# Patient Record
Sex: Female | Born: 1995 | Race: White | Hispanic: No | Marital: Single | Smoking: Never smoker
Health system: Southern US, Community
[De-identification: ages and names within clinical notes are randomized; demographics above are authoritative.]

## PROBLEM LIST (undated history)

## (undated) VITALS — BP 100/58 | HR 105 | Temp 98.3°F | Resp 18 | Ht 67.5 in | Wt 171.0 lb

## (undated) DIAGNOSIS — F419 Anxiety disorder, unspecified: Secondary | ICD-10-CM

## (undated) DIAGNOSIS — F4310 Post-traumatic stress disorder, unspecified: Secondary | ICD-10-CM

## (undated) DIAGNOSIS — K589 Irritable bowel syndrome without diarrhea: Secondary | ICD-10-CM

## (undated) DIAGNOSIS — Q796 Ehlers-Danlos syndrome, unspecified: Secondary | ICD-10-CM

## (undated) DIAGNOSIS — A0472 Enterocolitis due to Clostridium difficile, not specified as recurrent: Secondary | ICD-10-CM

## (undated) DIAGNOSIS — A1801 Tuberculosis of spine: Secondary | ICD-10-CM

## (undated) DIAGNOSIS — F32A Depression, unspecified: Secondary | ICD-10-CM

## (undated) DIAGNOSIS — F329 Major depressive disorder, single episode, unspecified: Secondary | ICD-10-CM

## (undated) HISTORY — DX: Major depressive disorder, single episode, unspecified: F32.9

## (undated) HISTORY — DX: Depression, unspecified: F32.A

## (undated) HISTORY — DX: Anxiety disorder, unspecified: F41.9

## (undated) HISTORY — DX: Irritable bowel syndrome, unspecified: K58.9

## (undated) HISTORY — PX: APPENDECTOMY: SHX54

## (undated) HISTORY — PX: NO PAST SURGERIES: SHX2092

---
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
## 2016-02-25 ENCOUNTER — Telehealth (HOSPITAL_COMMUNITY): Payer: Self-pay | Admitting: *Deleted

## 2016-02-25 NOTE — Telephone Encounter (Signed)
Called for Prior authorization of ECT. Was told by Morrie SheldonAshley that prior authorization was required for ECT and not sure why this writer was told in October 2016 that is was not required by [employer]. Was told that her insurance authorization is handled through [facility] and not [employer] and that is where it may have gotten mixed up. Was told to submit the authorization from printing the form online, filling it out, and faxing to [phone]. It will be denied but then an appeal needs completed through [facility] at [phone]. Reference # [id number]. Form completed and faxed as stated. Awaiting denial to complete appeal.

## 2016-03-31 ENCOUNTER — Telehealth (HOSPITAL_COMMUNITY): Payer: Self-pay | Admitting: *Deleted

## 2016-03-31 NOTE — Telephone Encounter (Signed)
Called for appeal of ECT authorization from 2016, spoke with Elenor QuinonesVan M who states to fill out Level 1 physician appeal form from online then fax with letter of appeal after MD signs. Sent email to April Keene for clarification of who this Clinical research associatewriter can fax form to for MD signature.

## 2016-04-08 ENCOUNTER — Observation Stay (HOSPITAL_COMMUNITY)
Admission: EM | Admit: 2016-04-08 | Discharge: 2016-04-13 | Disposition: A | Discharge status: Other Acute Inpatient Hospital | Payer: BC Managed Care – PPO | Attending: Internal Medicine | Admitting: Internal Medicine

## 2016-04-08 ENCOUNTER — Encounter (HOSPITAL_COMMUNITY): Payer: Self-pay

## 2016-04-08 DIAGNOSIS — F333 Major depressive disorder, recurrent, severe with psychotic symptoms: Secondary | ICD-10-CM | POA: Diagnosis not present

## 2016-04-08 DIAGNOSIS — Z79899 Other long term (current) drug therapy: Secondary | ICD-10-CM | POA: Insufficient documentation

## 2016-04-08 DIAGNOSIS — Z793 Long term (current) use of hormonal contraceptives: Secondary | ICD-10-CM | POA: Insufficient documentation

## 2016-04-08 DIAGNOSIS — A047 Enterocolitis due to Clostridium difficile: Secondary | ICD-10-CM | POA: Insufficient documentation

## 2016-04-08 DIAGNOSIS — F32A Depression, unspecified: Secondary | ICD-10-CM | POA: Diagnosis present

## 2016-04-08 DIAGNOSIS — Z9119 Patient's noncompliance with other medical treatment and regimen: Secondary | ICD-10-CM | POA: Diagnosis not present

## 2016-04-08 DIAGNOSIS — E43 Unspecified severe protein-calorie malnutrition: Secondary | ICD-10-CM | POA: Insufficient documentation

## 2016-04-08 DIAGNOSIS — F4310 Post-traumatic stress disorder, unspecified: Secondary | ICD-10-CM | POA: Diagnosis not present

## 2016-04-08 DIAGNOSIS — R45851 Suicidal ideations: Secondary | ICD-10-CM | POA: Diagnosis not present

## 2016-04-08 DIAGNOSIS — F329 Major depressive disorder, single episode, unspecified: Secondary | ICD-10-CM | POA: Diagnosis present

## 2016-04-08 DIAGNOSIS — A0472 Enterocolitis due to Clostridium difficile, not specified as recurrent: Secondary | ICD-10-CM

## 2016-04-08 DIAGNOSIS — R440 Auditory hallucinations: Secondary | ICD-10-CM | POA: Diagnosis present

## 2016-04-08 HISTORY — DX: Post-traumatic stress disorder, unspecified: F43.10

## 2016-04-08 LAB — RAPID URINE DRUG SCREEN, HOSP PERFORMED
Amphetamines: NOT DETECTED
Barbiturates: NOT DETECTED
Benzodiazepines: NOT DETECTED
Cocaine: NOT DETECTED
OPIATES: NOT DETECTED
TETRAHYDROCANNABINOL: NOT DETECTED

## 2016-04-08 LAB — COMPREHENSIVE METABOLIC PANEL
ALBUMIN: 4.8 g/dL (ref 3.5–5.0)
ALT: 21 U/L (ref 14–54)
AST: 19 U/L (ref 15–41)
Alkaline Phosphatase: 67 U/L (ref 38–126)
Anion gap: 8 (ref 5–15)
BILIRUBIN TOTAL: 1 mg/dL (ref 0.3–1.2)
BUN: 10 mg/dL (ref 6–20)
CALCIUM: 10.1 mg/dL (ref 8.9–10.3)
CO2: 23 mmol/L (ref 22–32)
Chloride: 107 mmol/L (ref 101–111)
Creatinine, Ser: 0.83 mg/dL (ref 0.44–1.00)
GFR calc Af Amer: >60 mL/min (ref >60)
GFR calc non Af Amer: >60 mL/min (ref >60)
GLUCOSE: 93 mg/dL (ref 65–99)
Potassium: 3.7 mmol/L (ref 3.5–5.1)
SODIUM: 138 mmol/L (ref 135–145)
TOTAL PROTEIN: 8.3 g/dL — AB (ref 6.5–8.1)

## 2016-04-08 LAB — CBC
HCT: 44.6 % (ref 36.0–46.0)
Hemoglobin: 15.3 g/dL — ABNORMAL HIGH (ref 12.0–15.0)
MCH: 29.5 pg (ref 26.0–34.0)
MCHC: 34.3 g/dL (ref 30.0–36.0)
MCV: 85.9 fL (ref 78.0–100.0)
PLATELETS: 215 10*3/uL (ref 150–400)
RBC: 5.19 MIL/uL — ABNORMAL HIGH (ref 3.87–5.11)
RDW: 13 % (ref 11.5–15.5)
WBC: 6.2 10*3/uL (ref 4.0–10.5)

## 2016-04-08 LAB — ETHANOL: Alcohol, Ethyl (B): <5 mg/dL (ref <5)

## 2016-04-08 LAB — HCG, QUANTITATIVE, PREGNANCY

## 2016-04-08 MED ORDER — VANCOMYCIN 50 MG/ML ORAL SOLUTION
125.0000 mg | Freq: Four times a day (QID) | ORAL | Status: DC
  Administered 2016-04-08 – 2016-04-13 (×18): 125 mg via ORAL
  Filled 2016-04-08 (×25): qty 2.5

## 2016-04-08 MED ORDER — CLONAZEPAM 0.5 MG PO TABS: 0.5000 mg | ORAL_TABLET | Freq: Three times a day (TID) | ORAL | Status: DC | PRN

## 2016-04-08 MED ORDER — RISAQUAD PO CAPS
1.0000 | ORAL_CAPSULE | Freq: Every day | ORAL | Status: DC
  Administered 2016-04-09 – 2016-04-10 (×2): 1 via ORAL
  Filled 2016-04-08 (×2): qty 1

## 2016-04-08 MED ORDER — MELATONIN 5 MG PO CAPS: 5.0000 mg | ORAL_CAPSULE | Freq: Every day | ORAL | Status: DC
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
## 2016-04-08 NOTE — BH Assessment (Signed)
Assessment completed. Consulted Maryjean Mornharles Kober, PA-C who recommended long-term psychiatric treatment. Informed Antony MaduraKelly  Humes, PA-C that inpatient treatment was recommended.
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
## 2016-04-09 NOTE — ED Notes (Signed)
Patient's mother visiting and became very angry when she was told that she could not take outside food/[employer] into the room. Patient's mother stated, "YOu guys are a friendly bunch." explained to patient's mother the visiting rules and outside food. Patient's mother provided with a copy of the visitation rules.

## 2016-04-09 NOTE — BH Assessment (Signed)
[facility] who reported that pt is under review for inpatient treatment. [facility] requested that pt be placed under IVC due to psychosis.  Pt meets IVC criteria at this time and IVC will be initiated.

## 2016-04-09 NOTE — ED Notes (Signed)
Patient ambulated without assistance to restroom. Informed about need for stool specimen. Patient was unable to have bowel movement. Patient understands the need for specimen

## 2016-04-09 NOTE — ED Notes (Signed)
Called Brandi Stanley and was told to call back in 15 minutes because the nurse was doing med pass.

## 2016-04-09 NOTE — ED Notes (Signed)
[place] [facility].

## 2016-04-09 NOTE — ED Notes (Signed)
Per Dr. Ewing SchleinMagod pt did [facility] at [facility] for Cdiff on 03/19/16. He recommends repeat testing

## 2016-04-09 NOTE — ED Notes (Signed)
Attempted x 4 to call [facility] to give report. Writer called the main number [phone] and the [facility] [phone]. charge nurse notified. Will continue to call.

## 2016-04-09 NOTE — ED Notes (Addendum)
Charge and EDP notified of [facility] Stanley requesting a c diff test since she has been taking Vancomycin for the same. Order noted for the same.

## 2016-04-09 NOTE — ED Notes (Signed)
Pt has verbalized in tears that pt "[facility]; I've been there and I don't like it.  Please let me stay here until there's available bed across the street".  TTS was called and notified of pt's verbalizations---- TTS stated that she will pass it on to the psychiatrist in the morning.

## 2016-04-09 NOTE — BH Assessment (Signed)
Per Ann[facility]ie at [facility], pt has been accepted for admission to the services of Dr. Otho Perlaj Thotakura. [facility] and report can be called to [phone]. Antony MaduraKelly Humes, PA-C has been informed of the disposition.

## 2016-04-10 ENCOUNTER — Encounter (HOSPITAL_COMMUNITY): Payer: Self-pay | Admitting: Internal Medicine

## 2016-04-10 DIAGNOSIS — A047 Enterocolitis due to Clostridium difficile: Secondary | ICD-10-CM | POA: Diagnosis not present

## 2016-04-10 DIAGNOSIS — F32A Depression, unspecified: Secondary | ICD-10-CM | POA: Diagnosis present

## 2016-04-10 DIAGNOSIS — A0472 Enterocolitis due to Clostridium difficile, not specified as recurrent: Secondary | ICD-10-CM

## 2016-04-10 DIAGNOSIS — F329 Major depressive disorder, single episode, unspecified: Secondary | ICD-10-CM | POA: Diagnosis not present

## 2016-04-10 DIAGNOSIS — F333 Major depressive disorder, recurrent, severe with psychotic symptoms: Secondary | ICD-10-CM | POA: Diagnosis not present

## 2016-04-10 DIAGNOSIS — F4312 Post-traumatic stress disorder, chronic: Secondary | ICD-10-CM | POA: Diagnosis not present

## 2016-04-10 DIAGNOSIS — R45851 Suicidal ideations: Secondary | ICD-10-CM | POA: Diagnosis not present

## 2016-04-10 LAB — C DIFFICILE QUICK SCREEN W PCR REFLEX
C DIFFICLE (CDIFF) ANTIGEN: NEGATIVE
C Diff interpretation: NOT DETECTED
C Diff toxin: NEGATIVE

## 2016-04-10 MED ORDER — ENOXAPARIN SODIUM 40 MG/0.4ML ~~LOC~~ SOLN
40.0000 mg | SUBCUTANEOUS | Status: DC
  Administered 2016-04-10 – 2016-04-12 (×3): 40 mg via SUBCUTANEOUS
  Filled 2016-04-10 (×3): qty 0.4

## 2016-04-10 MED ORDER — NORETHIN ACE-ETH ESTRAD-FE 1.5-30 MG-MCG PO TABS
1.0000 | ORAL_TABLET | Freq: Every day | ORAL | Status: DC
  Administered 2016-04-12 – 2016-04-13 (×2): 1 via ORAL

## 2016-04-10 MED ORDER — ONDANSETRON HCL 4 MG/2ML IJ SOLN: 4.0000 mg | Freq: Four times a day (QID) | INTRAMUSCULAR | Status: DC | PRN

## 2016-04-10 MED ORDER — OXCARBAZEPINE 300 MG PO TABS
600.0000 mg | ORAL_TABLET | Freq: Every day | ORAL | Status: DC
  Administered 2016-04-10 – 2016-04-13 (×4): 600 mg via ORAL
  Filled 2016-04-10 (×4): qty 2

## 2016-04-10 MED ORDER — RISPERIDONE 1 MG PO TABS
1.0000 mg | ORAL_TABLET | Freq: Every day | ORAL | Status: DC
  Administered 2016-04-10 – 2016-04-13 (×4): 1 mg via ORAL
  Filled 2016-04-10 (×4): qty 1

## 2016-04-10 MED ORDER — PRAZOSIN HCL 2 MG PO CAPS
2.0000 mg | ORAL_CAPSULE | Freq: Every day | ORAL | Status: DC
  Administered 2016-04-10 – 2016-04-12 (×3): 2 mg via ORAL
  Filled 2016-04-10 (×3): qty 1

## 2016-04-10 MED ORDER — ENSURE ENLIVE PO LIQD
237.0000 mL | Freq: Two times a day (BID) | ORAL | Status: DC
  Administered 2016-04-10 – 2016-04-12 (×3): 237 mL via ORAL

## 2016-04-10 MED ORDER — PAROXETINE HCL ER 12.5 MG PO TB24
12.5000 mg | ORAL_TABLET | Freq: Every day | ORAL | Status: DC
  Administered 2016-04-10 – 2016-04-13 (×4): 12.5 mg via ORAL
  Filled 2016-04-10 (×5): qty 1

## 2016-04-10 MED ORDER — MIRTAZAPINE 15 MG PO TABS
15.0000 mg | ORAL_TABLET | Freq: Every day | ORAL | Status: DC
  Administered 2016-04-10 – 2016-04-12 (×3): 15 mg via ORAL
  Filled 2016-04-10 (×3): qty 1

## 2016-04-10 MED ORDER — TRAZODONE HCL 100 MG PO TABS
100.0000 mg | ORAL_TABLET | Freq: Every day | ORAL | Status: DC
  Administered 2016-04-10 – 2016-04-12 (×3): 100 mg via ORAL
  Filled 2016-04-10 (×3): qty 1

## 2016-04-10 MED ORDER — VENLAFAXINE HCL ER 75 MG PO CP24
225.0000 mg | ORAL_CAPSULE | Freq: Every morning | ORAL | Status: DC
  Administered 2016-04-10 – 2016-04-13 (×4): 225 mg via ORAL
  Filled 2016-04-10 (×4): qty 1

## 2016-04-10 MED ORDER — PRAZOSIN HCL 2 MG PO CAPS: 2.0000 mg | ORAL_CAPSULE | Freq: Every day | ORAL | Status: DC

## 2016-04-10 MED ORDER — ONDANSETRON HCL 4 MG PO TABS: 4.0000 mg | ORAL_TABLET | Freq: Four times a day (QID) | ORAL | Status: DC | PRN

## 2016-04-10 MED ORDER — ARIPIPRAZOLE 5 MG PO TABS
5.0000 mg | ORAL_TABLET | Freq: Two times a day (BID) | ORAL | Status: DC
  Administered 2016-04-10 – 2016-04-13 (×7): 5 mg via ORAL
  Filled 2016-04-10 (×7): qty 1
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
## 2016-04-10 NOTE — ED Notes (Signed)
[facility] for [facility].

## 2016-04-10 NOTE — ED Notes (Signed)
Spoke with OV intake and OV nurse. [facility]. Intake and nurse aware that patient does not have loose stools and has had only solid stools since being in ED and can therefore not have c-diff test.

## 2016-04-10 NOTE — ED Provider Notes (Addendum)
Pt has not had any diarrhea for the last 24 hours however she is getting treatment for C diff.  Pt is not able to go to a psychiatric hosptial with that diagnosis.  Discussed with Dr Shela CommonsJ.   [facility].  I will consult with the hospitalist.   Linwood DibblesJon Jodee Wagenaar, MD 04/10/16 1149  Discussed with Dr Radonna RickerFeliz.  Requests ID consult.  Clinical Course  Comment By Time    Pt states she did have 3 loose stools yesterday.  1 loose stool 15 minutes ago.  Discussed with Dr Ilsa IhaSnyder.  Continue oral vanc 125 mg 4 times daily for 9 more days Linwood DibblesJon Derrell Milanes, MD 08/20 1220   Pt is not medically clear for psych disposition.   Linwood DibblesJon Naveh Rickles, MD 04/10/16 1224

## 2016-04-10 NOTE — ED Notes (Addendum)
Documented patients I&O based on off going techs report. Patient has had 2 bowl moments and neither were loose or watery. Both movements were formed stool.

## 2016-04-11 DIAGNOSIS — F4312 Post-traumatic stress disorder, chronic: Secondary | ICD-10-CM | POA: Diagnosis not present

## 2016-04-11 DIAGNOSIS — A047 Enterocolitis due to Clostridium difficile: Secondary | ICD-10-CM | POA: Diagnosis not present

## 2016-04-11 DIAGNOSIS — R45851 Suicidal ideations: Secondary | ICD-10-CM | POA: Diagnosis not present

## 2016-04-11 DIAGNOSIS — F329 Major depressive disorder, single episode, unspecified: Secondary | ICD-10-CM | POA: Diagnosis not present

## 2016-04-11 DIAGNOSIS — F333 Major depressive disorder, recurrent, severe with psychotic symptoms: Secondary | ICD-10-CM | POA: Diagnosis not present

## 2016-04-11 MED ORDER — PAROXETINE HCL ER 12.5 MG PO TB24: 12.5000 mg | ORAL_TABLET | Freq: Every day | ORAL | Status: DC
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
## 2016-04-11 NOTE — Progress Notes (Addendum)
LCSWA followind patient for disposition to inpatient psychiatric hospital: Faxed clinical to: [id number]:Pending [facility]:Denied due to inability to isolate patient due to C. difficile [facility]:Pending  [facility]: Pending  [facility]:No Beds [facility]  [facility]  [facility]:Pending   Will inform once bed is available.

## 2016-04-11 NOTE — Progress Notes (Deleted)
Date: April 11, 2016 Discharge orders checked for needs. No needs present at time of discharge. Auguste Tebbetts, RN, BSN, CCM   [phone] 

## 2016-04-11 NOTE — Discharge Summary (Signed)
Physician Discharge Summary  Baker PieriniBrenna J Polendo OZH:[id number]:[id number] DOB: 13-Jan-1996 DOA: 04/08/2016  PCP: Dahlia ByesUCKER, ELIZABETH, MD  Admit date: 04/08/2016 Discharge date: 04/11/2016  Admitted From: home [facility]  Recommendations for Outpatient Follow-up:  1. Follow up with PCP in 1-2 weeks 2.   Home Health:no Equipment/Devices:none  Discharge Condition:stable  CODE STATUS:Full Diet recommendation: Regular   Brief/Interim Summary: 20 year old with past medical history of anxiety and depression, and C. difficile who presented to the emergency room with a [facility] was consulted who recommended inpatient rehabilitation, due to the patient having ongoing diarrhea 2 to proceed if it was recommended that she admitted to the hospital until her C. difficile was better control.  Discharge Diagnoses:  Active Problems:   Depression   C. difficile colitis  She has been excepted to behavioral health, she was started on oral vancomycin, she did not have a single bowel movement while in the hospital. We'll continue vancomycin for total 14 days 4 times a day. She stable to be transferred to behavioral health.  Discharge Instructions  Discharge Instructions    Diet - low sodium heart healthy    Complete by:  As directed   Increase activity slowly    Complete by:  As directed       Medication List    TAKE these medications   acidophilus Caps capsule Take 1 capsule by mouth daily.   asenapine 5 MG Subl 24 hr tablet Commonly known as:  SAPHRIS Place 2 tablets (10 mg total) under the tongue at bedtime. For mood control   clonazePAM 0.5 MG tablet Commonly known as:  KLONOPIN Take 1 tablet (0.5 mg) three times daily: For severe anxiety   EPIPEN IJ Inject 1 Dose as directed once as needed (allergic reaction).   Lurasidone HCl 120 MG Tabs Take 1 tablet (120 mg total) by mouth at bedtime. For mood control   Melatonin 5 MG Caps Take 5 mg by mouth at bedtime.    MICROGESTIN FE 1.5/30 1.5-30 MG-MCG tablet Generic drug:  norethindrone-ethinyl estradiol-iron Take 1 tablet by mouth daily. For Hormonal replacement   mirtazapine 15 MG tablet Commonly known as:  REMERON Take 1 tablet (15 mg total) by mouth at bedtime. For depression/insomnia   naltrexone 50 MG tablet Commonly known as:  DEPADE Take 100 mg by mouth daily.   neomycin-bacitracin-polymyxin Oint Commonly known as:  NEOSPORIN Apply 1 application topically as needed for wound care.   oxcarbazepine 600 MG tablet Commonly known as:  TRILEPTAL Take 600 mg by mouth daily.   PARoxetine 12.5 MG 24 hr tablet Commonly known as:  PAXIL-CR Take 1 tablet (12.5 mg total) by mouth daily.   prazosin 2 MG capsule Commonly known as:  MINIPRESS Take 1 capsule (2 mg total) by mouth at bedtime. For nightmares   risperiDONE 1 MG tablet Commonly known as:  RISPERDAL Take 1 mg by mouth daily.   traZODone 100 MG tablet Commonly known as:  DESYREL Take 100 mg by mouth at bedtime.   VANCOCIN HCL 125 MG capsule Generic drug:  vancomycin Take 125 mg by mouth every 6 (six) hours.   venlafaxine XR 75 MG 24 hr capsule Commonly known as:  EFFEXOR-XR Take 3 capsules (225 mg total) by mouth every morning. For depression   VITAMIN B-12 PO Take 1 tablet by mouth daily.      Follow-up Information    Dahlia ByesUCKER, ELIZABETH, MD.   Specialty:  Pediatrics Why:  As needed Contact information: [address] [place] [id number] [phone]        [facility]-EMERGENCY DEPT.   Specialty:  Emergency Medicine Why:  If symptoms worsen Contact information: [address] [id number] [place] [id number] [phone]         Allergies  Allergen Reactions  . Bee Venom Anaphylaxis  . Percocet [Oxycodone-Acetaminophen] Shortness Of Breath and Itching  . Penicillins Hives and Other (See Comments)    Has patient had a PCN reaction causing immediate  rash, facial/tongue/throat swelling, SOB or lightheadedness with hypotension: No Has patient had a PCN reaction causing severe rash involving mucus membranes or skin necrosis: No Has patient had a PCN reaction that required hospitalization No Has patient had a PCN reaction occurring within the last 10 years: No If all of the above answers are "NO", then may proceed with Cephalosporin use.    Consultations: none  Procedures/Studies:  No results found.    Subjective:   Discharge Exam: Vitals:   04/10/16 2115 04/11/16 0525  BP: 130/82 134/74  Pulse: 77 (!) 119  Resp: 18 16  Temp: 97.8 F (36.6 C) 97.5 F (36.4 C)   Vitals:   04/10/16 1332 04/10/16 1422 04/10/16 2115 04/11/16 0525  BP:  121/67 130/82 134/74  Pulse:  77 77 (!) 119  Resp:  16 18 16   Temp:  97.7 F (36.5 C) 97.8 F (36.6 C) 97.5 F (36.4 C)  TempSrc:  Oral Oral Oral  SpO2:  98% 99% 98%  Weight:      Height: 5\' 8"  (1.727 m)       General: Pt is alert, awake, not in acute distress Cardiovascular: RRR, S1/S2 +, no rubs, no gallops Respiratory: CTA bilaterally, no wheezing, no rhonchi Abdominal: Soft, NT, ND, bowel sounds + Extremities: no edema, no cyanosis    The results of significant diagnostics from this hospitalization (including imaging, microbiology, ancillary and laboratory) are listed below for reference.     Microbiology: Recent Results (from the past 240 hour(s))  C difficile quick scan w PCR reflex     Status: None   Collection Time: 04/10/16 11:38 AM  Result Value Ref Range Status   C Diff antigen NEGATIVE NEGATIVE Final   C Diff toxin NEGATIVE NEGATIVE Final   C Diff interpretation No C. difficile detected.  Final     Labs: BNP (last 3 results) No results for input(s): BNP in the last 8760 hours. Basic Metabolic Panel:  Recent Labs Lab 04/08/16 1902  NA 138  K 3.7  CL 107  CO2 23  GLUCOSE 93  BUN 10  CREATININE 0.83  CALCIUM 10.1   Liver Function Tests:  Recent  Labs Lab 04/08/16 1902  AST 19  ALT 21  ALKPHOS 67  BILITOT 1.0  PROT 8.3*  ALBUMIN 4.8   No results for input(s): LIPASE, AMYLASE in the last 168 hours. No results for input(s): AMMONIA in the last 168 hours. CBC:  Recent Labs Lab 04/08/16 1902  WBC 6.2  HGB 15.3*  HCT 44.6  MCV 85.9  PLT 215   Cardiac Enzymes: No results for input(s): CKTOTAL, CKMB, CKMBINDEX, TROPONINI in the last 168 hours. BNP: Invalid input(s): POCBNP CBG: No results for input(s): GLUCAP in the last 168 hours. D-Dimer No results for input(s): DDIMER in the last 72 hours. Hgb A1c No results for input(s): HGBA1C in the last 72 hours. Lipid Profile No results for input(s): CHOL, HDL, LDLCALC, TRIG, CHOLHDL, LDLDIRECT in the last 72 hours. Thyroid function  studies No results for input(s): TSH, T4TOTAL, T3FREE, THYROIDAB in the last 72 hours.  Invalid input(s): FREET3 Anemia work up No results for input(s): VITAMINB12, FOLATE, FERRITIN, TIBC, IRON, RETICCTPCT in the last 72 hours. Urinalysis    Component Value Date/Time   COLORURINE YELLOW (A) 05/15/2015 1745   APPEARANCEUR TURBID (A) 05/15/2015 1745   LABSPEC 1.026 05/15/2015 1745   PHURINE 5.0 05/15/2015 1745   GLUCOSEU NEGATIVE 05/15/2015 1745   HGBUR NEGATIVE 05/15/2015 1745   BILIRUBINUR NEGATIVE 05/15/2015 1745   KETONESUR NEGATIVE 05/15/2015 1745   PROTEINUR NEGATIVE 05/15/2015 1745   NITRITE NEGATIVE 05/15/2015 1745   LEUKOCYTESUR 2+ (A) 05/15/2015 1745   Sepsis Labs Invalid input(s): PROCALCITONIN,  WBC,  LACTICIDVEN Microbiology Recent Results (from the past 240 hour(s))  C difficile quick scan w PCR reflex     Status: None   Collection Time: 04/10/16 11:38 AM  Result Value Ref Range Status   C Diff antigen NEGATIVE NEGATIVE Final   C Diff toxin NEGATIVE NEGATIVE Final   C Diff interpretation No C. difficile detected.  Final     Time coordinating discharge: Over 30 minutes  SIGNED:   Marinda Elk, MD  [facility] 04/11/2016, 8:01 AM Pager   If 7PM-7AM, please contact night-coverage [email or web address] Password [email or web address]

## 2016-04-12 DIAGNOSIS — F329 Major depressive disorder, single episode, unspecified: Secondary | ICD-10-CM | POA: Diagnosis not present

## 2016-04-12 DIAGNOSIS — A047 Enterocolitis due to Clostridium difficile: Secondary | ICD-10-CM | POA: Diagnosis not present
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
## 2016-04-13 DIAGNOSIS — F4312 Post-traumatic stress disorder, chronic: Secondary | ICD-10-CM | POA: Diagnosis not present

## 2016-04-13 DIAGNOSIS — F329 Major depressive disorder, single episode, unspecified: Secondary | ICD-10-CM | POA: Diagnosis not present

## 2016-04-13 DIAGNOSIS — F333 Major depressive disorder, recurrent, severe with psychotic symptoms: Secondary | ICD-10-CM | POA: Diagnosis not present

## 2016-04-13 DIAGNOSIS — E43 Unspecified severe protein-calorie malnutrition: Secondary | ICD-10-CM | POA: Insufficient documentation

## 2016-04-13 DIAGNOSIS — A047 Enterocolitis due to Clostridium difficile: Secondary | ICD-10-CM | POA: Diagnosis not present

## 2016-04-13 DIAGNOSIS — R45851 Suicidal ideations: Secondary | ICD-10-CM | POA: Diagnosis not present
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
## 2016-04-13 NOTE — Progress Notes (Signed)
[facility] [place] [facility]
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
## 2016-11-21 ENCOUNTER — Other Ambulatory Visit: Payer: Self-pay | Admitting: Physician Assistant

## 2016-11-21 DIAGNOSIS — R197 Diarrhea, unspecified: Secondary | ICD-10-CM

## 2016-11-21 DIAGNOSIS — K589 Irritable bowel syndrome without diarrhea: Secondary | ICD-10-CM

## 2016-11-21 DIAGNOSIS — R1030 Lower abdominal pain, unspecified: Secondary | ICD-10-CM

## 2016-11-21 DIAGNOSIS — R1010 Upper abdominal pain, unspecified: Secondary | ICD-10-CM

## 2016-11-22 ENCOUNTER — Ambulatory Visit (INDEPENDENT_AMBULATORY_CARE_PROVIDER_SITE_OTHER): Payer: BC Managed Care – PPO | Admitting: Neurology

## 2016-11-22 ENCOUNTER — Encounter (INDEPENDENT_AMBULATORY_CARE_PROVIDER_SITE_OTHER): Payer: Self-pay

## 2016-11-22 ENCOUNTER — Encounter: Payer: Self-pay | Admitting: Neurology

## 2016-11-22 VITALS — BP 124/78 | HR 72 | Ht 69.0 in | Wt 158.5 lb

## 2016-11-22 DIAGNOSIS — F333 Major depressive disorder, recurrent, severe with psychotic symptoms: Secondary | ICD-10-CM

## 2016-11-22 DIAGNOSIS — F4310 Post-traumatic stress disorder, unspecified: Secondary | ICD-10-CM
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
## 2016-11-22 NOTE — Patient Instructions (Signed)
We will get neuropsychological testing done. 

## 2016-11-24 ENCOUNTER — Ambulatory Visit
Admission: RE | Admit: 2016-11-24 | Discharge: 2016-11-24 | Disposition: A | Discharge status: Home / Self Care | Payer: BC Managed Care – PPO | Source: Ambulatory Visit | Attending: Physician Assistant | Admitting: Physician Assistant

## 2016-11-24 DIAGNOSIS — K589 Irritable bowel syndrome without diarrhea: Secondary | ICD-10-CM

## 2016-11-24 DIAGNOSIS — R197 Diarrhea, unspecified: Secondary | ICD-10-CM

## 2016-11-24 DIAGNOSIS — R1010 Upper abdominal pain, unspecified: Secondary | ICD-10-CM

## 2016-11-24 DIAGNOSIS — R1030 Lower abdominal pain, unspecified: Secondary | ICD-10-CM

## 2016-11-24 MED ORDER — IOPAMIDOL (ISOVUE-300) INJECTION 61%
100.0000 mL | Freq: Once | INTRAVENOUS | Status: AC | PRN
  Administered 2016-11-24: 100 mL via INTRAVENOUS

## 2017-03-20 ENCOUNTER — Other Ambulatory Visit (HOSPITAL_COMMUNITY): Payer: Self-pay | Admitting: Physician Assistant

## 2017-03-20 ENCOUNTER — Other Ambulatory Visit: Payer: Self-pay | Admitting: Physician Assistant

## 2017-03-20 DIAGNOSIS — R110 Nausea: Secondary | ICD-10-CM

## 2017-03-24 ENCOUNTER — Ambulatory Visit
Admission: RE | Admit: 2017-03-24 | Discharge: 2017-03-24 | Disposition: A | Discharge status: Home / Self Care | Payer: BC Managed Care – PPO | Source: Ambulatory Visit | Attending: Physician Assistant | Admitting: Physician Assistant

## 2017-03-24 DIAGNOSIS — R110 Nausea: Secondary | ICD-10-CM

## 2017-03-27 ENCOUNTER — Ambulatory Visit (HOSPITAL_COMMUNITY)
Admission: RE | Admit: 2017-03-27 | Discharge: 2017-03-27 | Disposition: A | Discharge status: Home / Self Care | Payer: BC Managed Care – PPO | Source: Ambulatory Visit | Attending: Physician Assistant | Admitting: Physician Assistant

## 2017-03-27 ENCOUNTER — Encounter (HOSPITAL_COMMUNITY): Payer: Self-pay | Admitting: Radiology

## 2017-03-27 DIAGNOSIS — R110 Nausea: Secondary | ICD-10-CM | POA: Insufficient documentation

## 2017-03-27 MED ORDER — TECHNETIUM TC 99M MEBROFENIN IV KIT
5.0000 | PACK | Freq: Once | INTRAVENOUS | Status: AC | PRN
  Administered 2017-03-27: 5 via INTRAVENOUS

## 2017-04-07 ENCOUNTER — Other Ambulatory Visit: Payer: Self-pay | Admitting: Gastroenterology

## 2017-04-07 DIAGNOSIS — R197 Diarrhea, unspecified: Secondary | ICD-10-CM

## 2017-04-14 ENCOUNTER — Ambulatory Visit
Admission: RE | Admit: 2017-04-14 | Discharge: 2017-04-14 | Disposition: A | Discharge status: Home / Self Care | Payer: BC Managed Care – PPO | Source: Ambulatory Visit | Attending: Gastroenterology | Admitting: Gastroenterology

## 2017-04-14 DIAGNOSIS — R197 Diarrhea, unspecified: Secondary | ICD-10-CM

## 2017-06-30 DIAGNOSIS — Z23 Encounter for immunization: Secondary | ICD-10-CM | POA: Diagnosis not present

## 2017-08-24 IMAGING — CR DG CHEST 2V
1 series · 2 of 2 positions shown · non-contrast
Comparison: None.

CLINICAL DATA: Anxiety and depression.

EXAM:
CHEST  2 VIEW

[Series 1: dg chest 2 view · 0.14mm/px · 2 of 2 slices shown]
[im 1/2]
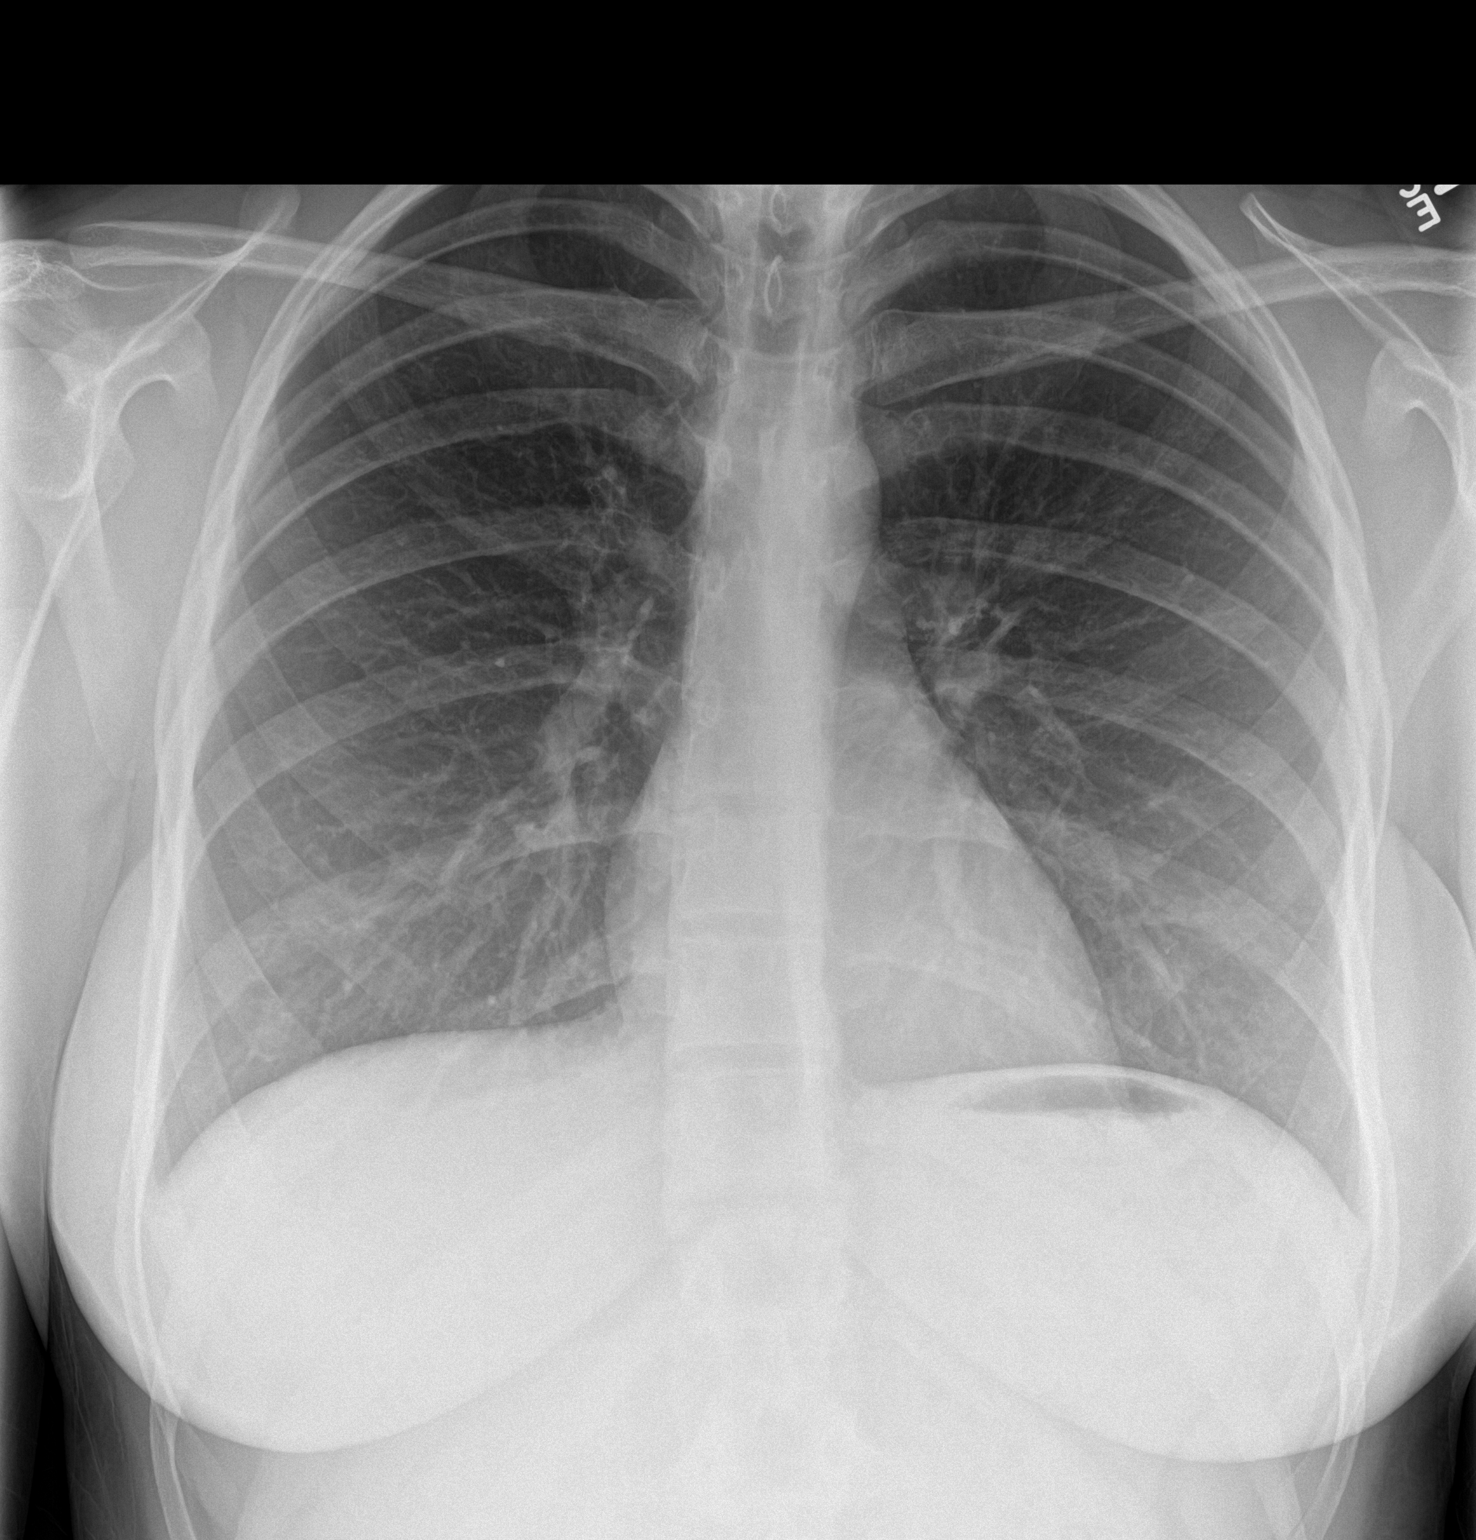
[im 2/2]
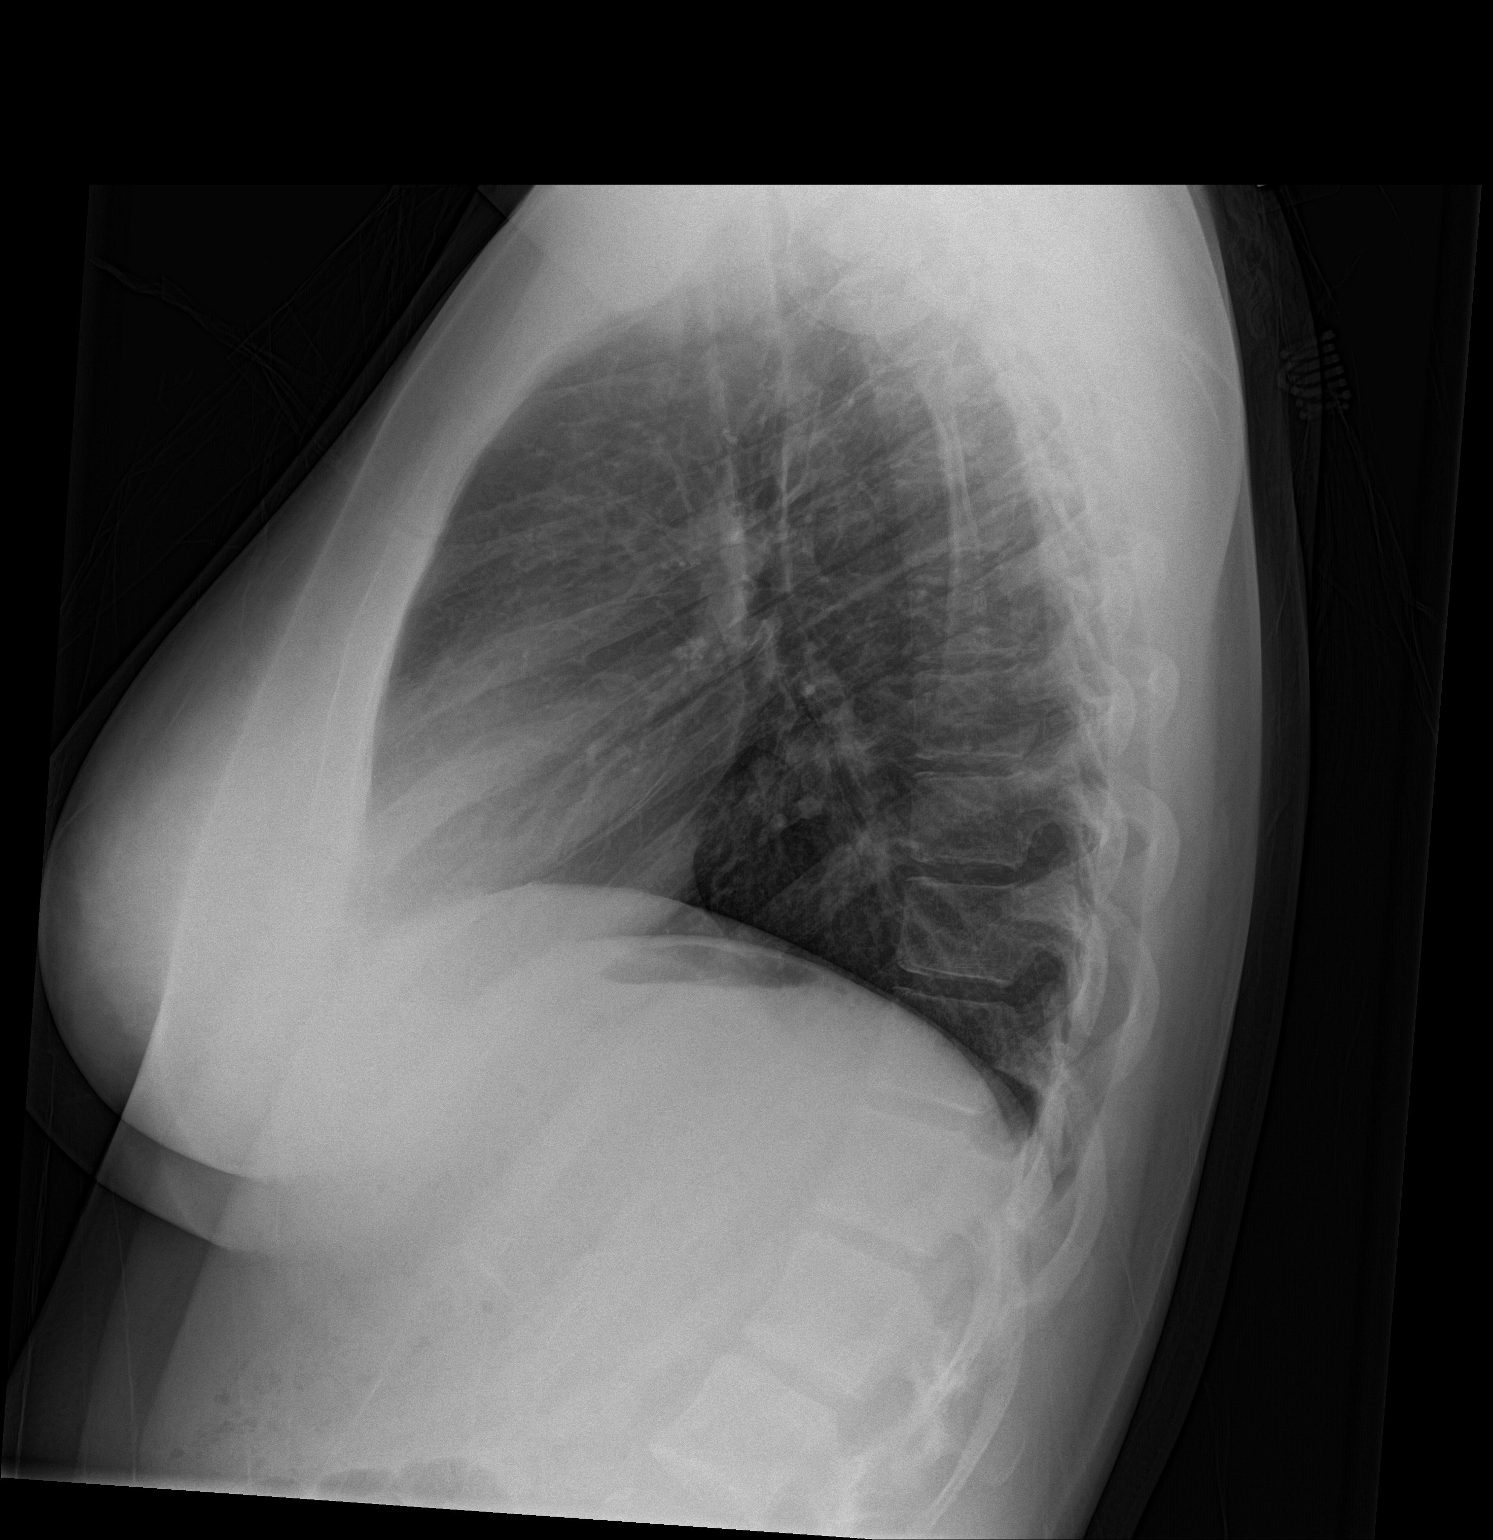

[2 of 2 positions shown; findings below may reference images not displayed]

FINDINGS: The heart size and mediastinal contours are within normal limits.
Both lungs are clear. The visualized skeletal structures are
unremarkable.
IMPRESSION: No active cardiopulmonary disease.

## 2017-10-25 DIAGNOSIS — F4310 Post-traumatic stress disorder, unspecified: Secondary | ICD-10-CM | POA: Diagnosis not present

## 2017-10-25 DIAGNOSIS — K580 Irritable bowel syndrome with diarrhea: Secondary | ICD-10-CM | POA: Diagnosis not present

## 2017-11-13 DIAGNOSIS — Z79899 Other long term (current) drug therapy: Secondary | ICD-10-CM | POA: Diagnosis not present

## 2017-11-13 DIAGNOSIS — F340 Cyclothymic disorder: Secondary | ICD-10-CM | POA: Diagnosis not present

## 2017-11-13 DIAGNOSIS — E559 Vitamin D deficiency, unspecified: Secondary | ICD-10-CM | POA: Diagnosis not present

## 2017-11-17 DIAGNOSIS — R509 Fever, unspecified: Secondary | ICD-10-CM | POA: Diagnosis not present

## 2017-11-17 DIAGNOSIS — M542 Cervicalgia: Secondary | ICD-10-CM | POA: Diagnosis not present

## 2017-11-17 DIAGNOSIS — B349 Viral infection, unspecified: Secondary | ICD-10-CM | POA: Diagnosis not present

## 2017-11-17 DIAGNOSIS — J029 Acute pharyngitis, unspecified: Secondary | ICD-10-CM | POA: Diagnosis not present

## 2017-11-17 DIAGNOSIS — R110 Nausea: Secondary | ICD-10-CM | POA: Diagnosis not present

## 2017-11-17 DIAGNOSIS — R51 Headache: Secondary | ICD-10-CM | POA: Diagnosis not present

## 2017-11-17 DIAGNOSIS — H5713 Ocular pain, bilateral: Secondary | ICD-10-CM | POA: Diagnosis not present

## 2017-12-20 ENCOUNTER — Encounter (HOSPITAL_BASED_OUTPATIENT_CLINIC_OR_DEPARTMENT_OTHER): Payer: Self-pay

## 2017-12-20 ENCOUNTER — Other Ambulatory Visit: Payer: Self-pay

## 2017-12-20 ENCOUNTER — Emergency Department (HOSPITAL_BASED_OUTPATIENT_CLINIC_OR_DEPARTMENT_OTHER)
Admission: EM | Admit: 2017-12-20 | Discharge: 2017-12-20 | Disposition: A | Discharge status: Home / Self Care | Payer: BC Managed Care – PPO | Attending: Emergency Medicine | Admitting: Emergency Medicine

## 2017-12-20 ENCOUNTER — Emergency Department (HOSPITAL_BASED_OUTPATIENT_CLINIC_OR_DEPARTMENT_OTHER): Payer: BC Managed Care – PPO

## 2017-12-20 DIAGNOSIS — Z79899 Other long term (current) drug therapy: Secondary | ICD-10-CM | POA: Insufficient documentation

## 2017-12-20 DIAGNOSIS — M79641 Pain in right hand: Secondary | ICD-10-CM | POA: Insufficient documentation

## 2017-12-20 MED ORDER — MELOXICAM 15 MG PO TABS: 15.0000 mg | ORAL_TABLET | Freq: Every day | ORAL | 0 refills | Status: DC

## 2017-12-20 MED FILL — MELOXICAM 15 MG TABLET: 15 | 30 days supply | Qty: 30 | Fill #0

## 2017-12-20 NOTE — ED Notes (Signed)
NAD at this time. Pt is stable and going home.  

## 2017-12-20 NOTE — ED Triage Notes (Signed)
C/o pain to right hand at 5th metacarpal x 2 weeks-denies injury-no bruising/swelling/break in skin noted-NAD-steady gait

## 2017-12-20 NOTE — Discharge Instructions (Signed)
Please read and follow all provided instructions.  You have been seen today for right hand pain  Tests performed today include: An x-ray of the affected area - does NOT show any broken bones or dislocations.  Vital signs. See below for your results today.   Home care instructions: -- *PRICE  Protect (with brace, splint, sling), if given by your provider (where the one that your state you already have at home) Rest Ice- Do not apply ice pack directly to your skin, place towel or similar between your skin and ice/ice pack. Apply ice for 20 min, then remove for 40 min while awake Compression- Wear brace, elastic bandage, splint as directed by your provider Elevate affected extremity above the level of your heart when not walking around for the first 24-48 hours   Use Mobic with food once daily as prescribed.   HOW TO MAKE AN ICE PACK  To make an ice pack, do one of the following:  Place crushed ice or a bag of frozen vegetables in a sealable plastic bag. Squeeze out the excess air. Place this bag inside another plastic bag. Slide the bag into a pillowcase or place a damp towel between your skin and the bag.  Mix 3 parts water with 1 part rubbing alcohol. Freeze the mixture in a sealable plastic bag. When you remove the mixture from the freezer, it will be slushy. Squeeze out the excess air. Place this bag inside another plastic bag. Slide the bag into a pillowcase or place a damp towel between your skin and the ice pack.   Follow-up instructions: Please follow-up with your primary care provider if you continue to have significant pain in 1 week. In this case you may have a more severe injury that requires further care.   Return instructions:  Please return if your toes or feet are numb or tingling, appear gray or blue, or you have severe pain (also elevate the leg and loosen splint or wrap if you were given one) If you have any fever, joint swelling, inability to move the wrist/fingers.    Please return to the Emergency Department if you experience worsening symptoms.  Please return if you have any other emergent concerns. Additional Information:  Your vital signs today were: BP 133/80 (BP Location: Left Arm)    Pulse 88    Temp 98.2 F (36.8 C) (Oral)    Resp 18    Ht  (1.753 m)    Wt 79.4 kg (175 lb 0.7 oz)    SpO2 99%    BMI 25.85 kg/m  If your blood pressure (BP) was elevated above 135/85 this visit, please have this repeated by your doctor within one month. ---------------
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
## 2018-04-20 ENCOUNTER — Emergency Department (HOSPITAL_BASED_OUTPATIENT_CLINIC_OR_DEPARTMENT_OTHER)
Admission: EM | Admit: 2018-04-20 | Discharge: 2018-04-20 | Disposition: A | Discharge status: Home / Self Care | Payer: BC Managed Care – PPO | Attending: Emergency Medicine | Admitting: Emergency Medicine

## 2018-04-20 ENCOUNTER — Encounter (HOSPITAL_BASED_OUTPATIENT_CLINIC_OR_DEPARTMENT_OTHER): Payer: Self-pay

## 2018-04-20 ENCOUNTER — Other Ambulatory Visit: Payer: Self-pay

## 2018-04-20 DIAGNOSIS — R42 Dizziness and giddiness: Secondary | ICD-10-CM | POA: Insufficient documentation

## 2018-04-20 DIAGNOSIS — R1084 Generalized abdominal pain: Secondary | ICD-10-CM

## 2018-04-20 DIAGNOSIS — R110 Nausea: Secondary | ICD-10-CM | POA: Diagnosis not present

## 2018-04-20 DIAGNOSIS — Z79899 Other long term (current) drug therapy: Secondary | ICD-10-CM | POA: Diagnosis not present

## 2018-04-20 DIAGNOSIS — R197 Diarrhea, unspecified: Secondary | ICD-10-CM

## 2018-04-20 HISTORY — DX: Enterocolitis due to Clostridium difficile, not specified as recurrent: A04.72

## 2018-04-20 LAB — CBC WITH DIFFERENTIAL/PLATELET
Basophils Absolute: 0 10*3/uL (ref 0.0–0.1)
Basophils Relative: 1 %
EOS ABS: 0 10*3/uL (ref 0.0–0.7)
Eosinophils Relative: 1 %
HEMATOCRIT: 39.5 % (ref 36.0–46.0)
HEMOGLOBIN: 13.6 g/dL (ref 12.0–15.0)
LYMPHS ABS: 1.3 10*3/uL (ref 0.7–4.0)
Lymphocytes Relative: 24 %
MCH: 30 pg (ref 26.0–34.0)
MCHC: 34.4 g/dL (ref 30.0–36.0)
MCV: 87 fL (ref 78.0–100.0)
MONO ABS: 0.6 10*3/uL (ref 0.1–1.0)
MONOS PCT: 11 %
NEUTROS ABS: 3.5 10*3/uL (ref 1.7–7.7)
Neutrophils Relative %: 65 %
Platelets: 165 10*3/uL (ref 150–400)
RBC: 4.54 MIL/uL (ref 3.87–5.11)
RDW: 13 % (ref 11.5–15.5)
WBC: 5.4 10*3/uL (ref 4.0–10.5)

## 2018-04-20 LAB — URINALYSIS, ROUTINE W REFLEX MICROSCOPIC
BILIRUBIN URINE: NEGATIVE
Glucose, UA: NEGATIVE mg/dL
HGB URINE DIPSTICK: NEGATIVE
KETONES UR: NEGATIVE mg/dL
Leukocytes, UA: NEGATIVE
Nitrite: NEGATIVE
PH: 5.5 (ref 5.0–8.0)
Protein, ur: NEGATIVE mg/dL

## 2018-04-20 LAB — BASIC METABOLIC PANEL
ANION GAP: 10 (ref 5–15)
BUN: 14 mg/dL (ref 6–20)
CALCIUM: 9.2 mg/dL (ref 8.9–10.3)
CO2: 22 mmol/L (ref 22–32)
CREATININE: 0.76 mg/dL (ref 0.44–1.00)
Chloride: 105 mmol/L (ref 98–111)
GFR calc Af Amer: >60 mL/min (ref >60)
GFR calc non Af Amer: >60 mL/min (ref >60)
GLUCOSE: 88 mg/dL (ref 70–99)
Potassium: 3.6 mmol/L (ref 3.5–5.1)
Sodium: 137 mmol/L (ref 135–145)

## 2018-04-20 LAB — PREGNANCY, URINE: Preg Test, Ur: NEGATIVE

## 2018-04-20 MED ORDER — ONDANSETRON HCL 4 MG/2ML IJ SOLN
4.0000 mg | Freq: Once | INTRAMUSCULAR | Status: AC
  Administered 2018-04-20: 4 mg via INTRAVENOUS
  Filled 2018-04-20: qty 2

## 2018-04-20 MED ORDER — ONDANSETRON HCL 4 MG PO TABS: 4.0000 mg | ORAL_TABLET | Freq: Four times a day (QID) | ORAL | 0 refills | Status: DC

## 2018-04-20 MED ORDER — SODIUM CHLORIDE 0.9 % IV BOLUS
1000.0000 mL | Freq: Once | INTRAVENOUS | Status: AC
  Administered 2018-04-20: 1000 mL via INTRAVENOUS

## 2018-04-20 NOTE — ED Notes (Signed)
Attempted to give stool sample- unsuccessful at this time.

## 2018-04-20 NOTE — ED Notes (Signed)
Pt given instructions of how to collect stool sample for C-diff and GI panel. Pt to bring back when collected.

## 2018-04-20 NOTE — ED Notes (Signed)
ED Provider at bedside. 
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
## 2018-04-20 NOTE — ED Triage Notes (Signed)
Pt has had watery diarrhea with nausea for a week, hx of c. Diff, has not been on any abx in over a year, has not had known exposure, noticing mucus in stool, no bleeding, no fevers

## 2018-04-20 NOTE — Discharge Instructions (Signed)
Your evaluated in the emergency department for crampy abdominal pain and frequent diarrhea.  Your lab work did not show any obvious abnormalities.  You were unable to provide a stool sample here and so we are sending you home with a specimen cup to bring back for the lab.  You should continue a clear liquid diet and advance as tolerated.  We are prescribing you some Zofran for nausea.

## 2018-04-27 LAB — GASTROINTESTINAL PANEL BY PCR, STOOL (REPLACES STOOL CULTURE)

## 2018-05-25 ENCOUNTER — Encounter: Payer: Self-pay | Admitting: Sports Medicine

## 2018-05-25 ENCOUNTER — Ambulatory Visit (INDEPENDENT_AMBULATORY_CARE_PROVIDER_SITE_OTHER): Payer: BC Managed Care – PPO | Admitting: Sports Medicine

## 2018-05-25 VITALS — BP 114/72 | Ht 69.0 in | Wt 180.0 lb

## 2018-05-25 DIAGNOSIS — M357 Hypermobility syndrome: Secondary | ICD-10-CM

## 2018-05-25 MED ORDER — NORTRIPTYLINE HCL 25 MG PO CAPS: 25.0000 mg | ORAL_CAPSULE | Freq: Three times a day (TID) | ORAL | 1 refills | Status: DC
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
## 2018-05-25 NOTE — Patient Instructions (Signed)
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  Today we will start you on nortriptyline 25 mg 3 times daily.  Take this medication once daily at night for the first 5 days to ensure you tolerated well.  After that increase the frequency to 3 times per day.  We will also refer you to physical therapy with Id[facility] Needle, who specializes in patients with Ehlers-Danlos.  We will have you follow-up with me in 1 month.

## 2018-06-12 ENCOUNTER — Ambulatory Visit: Payer: BC Managed Care – PPO | Attending: Sports Medicine | Admitting: Physical Therapy

## 2018-06-12 ENCOUNTER — Other Ambulatory Visit: Payer: Self-pay

## 2018-06-12 ENCOUNTER — Encounter: Payer: Self-pay | Admitting: Physical Therapy

## 2018-06-12 DIAGNOSIS — M25552 Pain in left hip: Secondary | ICD-10-CM | POA: Insufficient documentation

## 2018-06-12 DIAGNOSIS — M545 Low back pain, unspecified: Secondary | ICD-10-CM

## 2018-06-12 DIAGNOSIS — M6281 Muscle weakness (generalized): Secondary | ICD-10-CM | POA: Insufficient documentation

## 2018-06-12 DIAGNOSIS — M25551 Pain in right hip: Secondary | ICD-10-CM | POA: Insufficient documentation

## 2018-06-12 DIAGNOSIS — R293 Abnormal posture: Secondary | ICD-10-CM | POA: Diagnosis present

## 2018-06-12 NOTE — Patient Instructions (Signed)
Step 1  Step 2  Sidelying Hip Abduction reps: 10  sets: 2  hold: 5  daily: 2  weekly: 7 Setup  Begin lying on your side with your top leg straight and your bottom leg bent. Movement  Lift your top leg up toward the ceiling, then slowly lower it back down and repeat. Tip  Make sure to keep your leg straight and do not let your hips roll backward or forward during the exercise. Step 1  Step 2  Sidelying Hip Adduction reps: 10  sets: 2  hold: 5  daily: 2  weekly: 7 Setup  Begin lying on your side with your top leg and hip bent and your bottom leg straight. Movement  Lift your bottom leg up toward the ceiling, then slowly lower it back down and repeat. Tip  Make sure to keep your leg straight and do not let your hips roll backward or forward during the exercise.
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
## 2018-06-19 ENCOUNTER — Encounter: Payer: Self-pay | Admitting: Physical Therapy

## 2018-06-19 ENCOUNTER — Ambulatory Visit: Payer: BC Managed Care – PPO | Admitting: Physical Therapy

## 2018-06-19 DIAGNOSIS — M25552 Pain in left hip: Secondary | ICD-10-CM | POA: Diagnosis not present

## 2018-06-19 DIAGNOSIS — M25551 Pain in right hip: Secondary | ICD-10-CM

## 2018-06-19 DIAGNOSIS — M6281 Muscle weakness (generalized): Secondary | ICD-10-CM | POA: Diagnosis not present

## 2018-06-19 DIAGNOSIS — M545 Low back pain, unspecified: Secondary | ICD-10-CM

## 2018-06-19 DIAGNOSIS — R293 Abnormal posture: Secondary | ICD-10-CM | POA: Diagnosis not present
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
## 2018-06-25 ENCOUNTER — Ambulatory Visit: Payer: BC Managed Care – PPO | Admitting: Physical Therapy

## 2018-06-25 DIAGNOSIS — Z23 Encounter for immunization: Secondary | ICD-10-CM | POA: Diagnosis not present

## 2018-06-28 ENCOUNTER — Encounter: Payer: Self-pay | Admitting: Sports Medicine

## 2018-06-28 ENCOUNTER — Other Ambulatory Visit: Payer: Self-pay

## 2018-06-28 ENCOUNTER — Emergency Department (HOSPITAL_COMMUNITY)
Admission: EM | Admit: 2018-06-28 | Discharge: 2018-06-29 | Disposition: A | Discharge status: Other Acute Inpatient Hospital | Payer: BC Managed Care – PPO | Attending: Emergency Medicine | Admitting: Emergency Medicine

## 2018-06-28 ENCOUNTER — Emergency Department (HOSPITAL_COMMUNITY): Payer: BC Managed Care – PPO

## 2018-06-28 ENCOUNTER — Ambulatory Visit (INDEPENDENT_AMBULATORY_CARE_PROVIDER_SITE_OTHER): Payer: BC Managed Care – PPO | Admitting: Sports Medicine

## 2018-06-28 ENCOUNTER — Encounter (HOSPITAL_COMMUNITY): Payer: Self-pay | Admitting: Emergency Medicine

## 2018-06-28 VITALS — BP 123/78 | Ht 69.0 in | Wt 180.0 lb

## 2018-06-28 DIAGNOSIS — R440 Auditory hallucinations: Secondary | ICD-10-CM | POA: Diagnosis not present

## 2018-06-28 DIAGNOSIS — Y929 Unspecified place or not applicable: Secondary | ICD-10-CM | POA: Insufficient documentation

## 2018-06-28 DIAGNOSIS — S8991XA Unspecified injury of right lower leg, initial encounter: Secondary | ICD-10-CM | POA: Diagnosis present

## 2018-06-28 DIAGNOSIS — Z046 Encounter for general psychiatric examination, requested by authority: Secondary | ICD-10-CM | POA: Diagnosis not present

## 2018-06-28 DIAGNOSIS — F329 Major depressive disorder, single episode, unspecified: Secondary | ICD-10-CM | POA: Insufficient documentation

## 2018-06-28 DIAGNOSIS — Z23 Encounter for immunization: Secondary | ICD-10-CM | POA: Diagnosis not present

## 2018-06-28 DIAGNOSIS — Y999 Unspecified external cause status: Secondary | ICD-10-CM | POA: Diagnosis not present

## 2018-06-28 DIAGNOSIS — Z79899 Other long term (current) drug therapy: Secondary | ICD-10-CM | POA: Insufficient documentation

## 2018-06-28 DIAGNOSIS — F419 Anxiety disorder, unspecified: Secondary | ICD-10-CM | POA: Insufficient documentation

## 2018-06-28 DIAGNOSIS — Y9389 Activity, other specified: Secondary | ICD-10-CM | POA: Insufficient documentation

## 2018-06-28 DIAGNOSIS — S81811A Laceration without foreign body, right lower leg, initial encounter: Secondary | ICD-10-CM | POA: Diagnosis not present

## 2018-06-28 DIAGNOSIS — X781XXA Intentional self-harm by knife, initial encounter: Secondary | ICD-10-CM | POA: Diagnosis not present

## 2018-06-28 DIAGNOSIS — Z7289 Other problems related to lifestyle: Secondary | ICD-10-CM | POA: Insufficient documentation

## 2018-06-28 DIAGNOSIS — M357 Hypermobility syndrome: Secondary | ICD-10-CM | POA: Diagnosis not present

## 2018-06-28 LAB — COMPREHENSIVE METABOLIC PANEL
ALK PHOS: 57 U/L (ref 38–126)
ALT: 16 U/L (ref 0–44)
AST: 19 U/L (ref 15–41)
Albumin: 4.2 g/dL (ref 3.5–5.0)
Anion gap: 10 (ref 5–15)
BILIRUBIN TOTAL: 0.5 mg/dL (ref 0.3–1.2)
BUN: 10 mg/dL (ref 6–20)
CALCIUM: 9.3 mg/dL (ref 8.9–10.3)
CO2: 20 mmol/L — AB (ref 22–32)
CREATININE: 0.83 mg/dL (ref 0.44–1.00)
Chloride: 107 mmol/L (ref 98–111)
GFR calc Af Amer: >60 mL/min (ref >60)
GFR calc non Af Amer: >60 mL/min (ref >60)
Glucose, Bld: 86 mg/dL (ref 70–99)
Potassium: 3.5 mmol/L (ref 3.5–5.1)
SODIUM: 137 mmol/L (ref 135–145)
Total Protein: 7.1 g/dL (ref 6.5–8.1)

## 2018-06-28 LAB — RAPID URINE DRUG SCREEN, HOSP PERFORMED
AMPHETAMINES: NOT DETECTED
Barbiturates: NOT DETECTED
Benzodiazepines: NOT DETECTED
Cocaine: NOT DETECTED
OPIATES: NOT DETECTED
Tetrahydrocannabinol: NOT DETECTED

## 2018-06-28 LAB — ETHANOL: Alcohol, Ethyl (B): <10 mg/dL (ref <10)

## 2018-06-28 LAB — I-STAT BETA HCG BLOOD, ED (MC, WL, AP ONLY)

## 2018-06-28 LAB — CBC
HEMATOCRIT: 40.1 % (ref 36.0–46.0)
Hemoglobin: 13.4 g/dL (ref 12.0–15.0)
MCH: 29.4 pg (ref 26.0–34.0)
MCHC: 33.4 g/dL (ref 30.0–36.0)
MCV: 87.9 fL (ref 80.0–100.0)
Platelets: 165 10*3/uL (ref 150–400)
RBC: 4.56 MIL/uL (ref 3.87–5.11)
RDW: 12 % (ref 11.5–15.5)
WBC: 6.1 10*3/uL (ref 4.0–10.5)
nRBC: 0 % (ref 0.0–0.2)

## 2018-06-28 LAB — ACETAMINOPHEN LEVEL: Acetaminophen (Tylenol), Serum: <10 ug/mL — ABNORMAL LOW (ref 10–30)

## 2018-06-28 LAB — SALICYLATE LEVEL: Salicylate Lvl: <7 mg/dL (ref 2.8–30.0)

## 2018-06-28 MED ORDER — TETANUS-DIPHTH-ACELL PERTUSSIS 5-2.5-18.5 LF-MCG/0.5 IM SUSP
0.5000 mL | Freq: Once | INTRAMUSCULAR | Status: AC
  Administered 2018-06-28: 0.5 mL via INTRAMUSCULAR
  Filled 2018-06-28: qty 0.5

## 2018-06-28 MED ORDER — LIDOCAINE HCL (PF) 1 % IJ SOLN
30.0000 mL | Freq: Once | INTRAMUSCULAR | Status: AC
  Administered 2018-06-28: 30 mL via INTRADERMAL
  Filled 2018-06-28: qty 30
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
## 2018-06-28 NOTE — Patient Instructions (Signed)
Continue physical therapy Look into the bike gloves for finger support Also consider the wring splints Knee sleeves for support during the day.  I think the compression knee sleeves will offer some support and stability and help to prevent as much leg fatigue during the day.  If these are not providing adequate relief, the next step would be hinged knee braces. Continue nortriptyline.  We will let your psychiatrist continue to titrate the dose. Follow up in 6 weeks

## 2018-06-28 NOTE — ED Notes (Signed)
Patient transported to X-ray 
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
## 2018-06-28 NOTE — ED Notes (Signed)
Personal belongings inventoried and placed at locker #2 PodF.

## 2018-06-28 NOTE — Progress Notes (Signed)
  Brandi Stanley - 22 y.o. 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 Patient was started in physical therapy and also started on nortriptyline 25 mg 3 times daily at her last visit.  Overall, she is happy with the physical therapy thus far and states that she will be attending more frequently in the upcoming weeks.  She feels she is gotten the most benefit from the nortriptyline.  This has benefit her in terms of her pain as well as her depression.  She states that this is the only medication she has noticed a significant improvement in her depression.  She states that her psychiatrist plans to titrate up her dose further for better control of her depressive symptoms.  She continues to have hip, knee and ankle pain which is worse at the end of the day.  She also notes having shoulder pain if she sleeps on her side at night.  Otherwise, no worsening joint pain or new injuries.  No numbness or tingling.  No associated skin changes. Patient does report some pain in her fingers due to hyperextension when trying to grip or open things such as jars.   ROS:     See HPI  PERTINENT  PMH / PSH FH / / SH:  Past Medical, Surgical, Social, and Family History Reviewed & Updated in the EMR.  Pertinent findings include:  EDS  OBJECTIVE: BP 123/78   Ht 5\' 9"  (1.753 m)   Wt 180 lb (81.6 kg)   BMI 26.58 kg/m   Physical Exam:  Vital signs are reviewed.  GEN: Alert and oriented, NAD Pulm: Breathing unlabored PSY: normal mood, congruent affect  MSK: Right shoulder: No obvious deformity.   Full range of motion with 5/5 strength No pain with rotator cuff testing N/V intact  Left shoulder No deformity Full range of motion 5/5 strength without pain N/V intact  Bilateral hands: Hyperextension at the MCP, PIP, and DIP bilaterally No localized swelling or erythema No tenderness  palpation N/V intact  Bilateral knees: No obvious deformity bilaterally.  No effusion or erythema There is no tenderness palpation of the left or right knee Marked hyperextension of the knees bilaterally 5/5 strength   ASSESSMENT & PLAN:  1.  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  She is noticing improvement with nortriptyline and plans to increase the frequency of her physical therapy. -Continue nortriptyline.  Will allow further titration of her dosage by her psychiatrist -Continue physical therapy - Recommended fingerless bike gloves for support of the joints of her fingers.  She may also look into ring splints. - Recommend compression knee sleeves for support -She will follow-up in 6 weeks  I was the preceptor for this visit and available for immediate consultation. Reino Bellis, DO

## 2018-06-28 NOTE — ED Notes (Signed)
Pts belongings placed in purple zone locker #2.  Pt was wanded by security and placed in green hallway

## 2018-06-28 NOTE — ED Notes (Signed)
Staffing notified for sitter, security notified pt needs to be wanded. CN aware pt has service dog for PTSD.

## 2018-06-29 DIAGNOSIS — S81811A Laceration without foreign body, right lower leg, initial encounter: Secondary | ICD-10-CM | POA: Diagnosis not present

## 2018-06-29 MED ORDER — GABAPENTIN 400 MG PO CAPS
400.0000 mg | ORAL_CAPSULE | Freq: Three times a day (TID) | ORAL | Status: DC
  Administered 2018-06-29: 400 mg via ORAL
  Filled 2018-06-29: qty 1

## 2018-06-29 MED ORDER — NORTRIPTYLINE HCL 25 MG PO CAPS
25.0000 mg | ORAL_CAPSULE | Freq: Three times a day (TID) | ORAL | Status: DC
  Administered 2018-06-29: 25 mg via ORAL
  Filled 2018-06-29 (×2): qty 1

## 2018-06-29 MED ORDER — SUCRALFATE 1 GM/10ML PO SUSP
1.0000 g | Freq: Three times a day (TID) | ORAL | Status: DC
  Filled 2018-06-29 (×3): qty 10

## 2018-06-29 MED ORDER — DOXAZOSIN MESYLATE 8 MG PO TABS: 8.0000 mg | ORAL_TABLET | Freq: Every day | ORAL | Status: DC

## 2018-06-29 MED ORDER — DICYCLOMINE HCL 10 MG PO CAPS
10.0000 mg | ORAL_CAPSULE | Freq: Three times a day (TID) | ORAL | Status: DC
  Filled 2018-06-29: qty 1

## 2018-06-29 NOTE — ED Notes (Signed)
Called [facility] for pickup 

## 2018-06-29 NOTE — BH Assessment (Signed)
06/29/2018: Patient meets criteria for inpatient treatment. Referred to the following facilities (pending review):  [facility]

## 2018-06-29 NOTE — Progress Notes (Signed)
Pt accepted to [facility] [facility].  Dr. Dorathy Daft is the accepting/attending provider.   Call report to [phone] [facility] ED notified.    Pt is voluntary and can be transported by [employer].  Pt is scheduled to arrive at [facility] as soon as transportation can be arranged.   Wells Guiles, LCSW, LCAS Disposition CSW [facility]/TTS [phone]

## 2018-06-29 NOTE — ED Notes (Signed)
Pt. Given cell phone to make phone calls.  Pt. Has a service dog and is calling her Gramma to come and get the dog and check on her dog at home.  Plan of care updated with pt.  Pt. Is going to be in- patient.

## 2018-06-29 NOTE — ED Notes (Signed)
TTS video interview in progress .  

## 2018-06-29 NOTE — B[facility] Counselor (Signed)
  [facility] Disposition  Disposition: LPC discussed case with BH provider, Brandi Sievert, PA-C who recommends inpatient treatment.  [facility] informed ER provider, Dr. Adela Lank and pt's nurse of the recommended disposition.  TTS will look for placement.  Jubilee Vivero L. Harim Bi, MS, LPC, [facility] Therapeutic Triage Specialist  [phone]
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
## 2018-06-29 NOTE — ED Notes (Signed)
Security has pt in keys in safe (envelope [id number]) receipt number [id number] to be picked by her mother on Saturday 06/30/18.

## 2018-06-29 NOTE — ED Notes (Signed)
Called Dr. Erma Heritage again to ask him to see this pt.

## 2018-06-29 NOTE — ED Notes (Signed)
[facility] accepting provider Erling Conte.

## 2018-07-02 ENCOUNTER — Ambulatory Visit: Payer: BC Managed Care – PPO | Admitting: Physical Therapy

## 2018-07-30 ENCOUNTER — Ambulatory Visit: Payer: BC Managed Care – PPO | Attending: Sports Medicine | Admitting: Physical Therapy

## 2018-07-30 ENCOUNTER — Encounter: Payer: Self-pay | Admitting: Physical Therapy

## 2018-07-30 DIAGNOSIS — M6281 Muscle weakness (generalized): Secondary | ICD-10-CM | POA: Insufficient documentation

## 2018-07-30 DIAGNOSIS — M545 Low back pain, unspecified: Secondary | ICD-10-CM

## 2018-07-30 DIAGNOSIS — M25551 Pain in right hip: Secondary | ICD-10-CM | POA: Diagnosis present

## 2018-07-30 DIAGNOSIS — R293 Abnormal posture: Secondary | ICD-10-CM | POA: Insufficient documentation

## 2018-07-30 DIAGNOSIS — M25552 Pain in left hip: Secondary | ICD-10-CM | POA: Insufficient documentation
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
## 2018-08-07 ENCOUNTER — Ambulatory Visit: Payer: BC Managed Care – PPO | Admitting: Physical Therapy

## 2018-08-07 ENCOUNTER — Encounter: Payer: Self-pay | Admitting: Physical Therapy

## 2018-08-07 DIAGNOSIS — M6281 Muscle weakness (generalized): Secondary | ICD-10-CM | POA: Diagnosis not present

## 2018-08-07 DIAGNOSIS — M545 Low back pain, unspecified: Secondary | ICD-10-CM

## 2018-08-07 DIAGNOSIS — M25552 Pain in left hip: Secondary | ICD-10-CM

## 2018-08-07 DIAGNOSIS — M25551 Pain in right hip: Secondary | ICD-10-CM

## 2018-08-07 DIAGNOSIS — R293 Abnormal posture: Secondary | ICD-10-CM
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
## 2018-08-09 ENCOUNTER — Ambulatory Visit (INDEPENDENT_AMBULATORY_CARE_PROVIDER_SITE_OTHER): Payer: BC Managed Care – PPO | Admitting: Sports Medicine

## 2018-08-09 ENCOUNTER — Encounter: Payer: Self-pay | Admitting: Sports Medicine

## 2018-08-09 VITALS — BP 120/63 | Ht 69.0 in | Wt 180.0 lb

## 2018-08-09 DIAGNOSIS — M357 Hypermobility syndrome: Secondary | ICD-10-CM

## 2018-08-09 NOTE — Progress Notes (Signed)
  Brandi Stanley - 22 y.o. 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  She has been being treated for polyarthralgia secondary to her hypermobility.  Today she reports she has been doing very well.  She did miss a few weeks of physical therapy but overall feels is been especially helpful.  She continues to get benefit from nortriptyline and has been tolerating this well.  Her psychiatrist has been titrating up her dose to better treat her depressive symptoms.  She has no new or worsening complaints today.  She does continue to have some pain with the DIP joints of the second and third fingers bilaterally.  These give her pain when having to press buttons or open jars.  At her prior visit, we did discuss different bracing options.  She intends to purchase these when money is available.  She also plans to buy compression knee sleeves for support when able.  Her physical therapist has been applying Kinesiotape which she states makes her knees feel more stable.   ROS:     See HPI  PERTINENT  PMH / PSH FH / / SH:  Past Medical, Surgical, Social, and Family History Reviewed & Updated in the EMR.     OBJECTIVE: BP 120/63   Ht 5\' 9"  (1.753 m)   Wt 180 lb (81.6 kg)   BMI 26.58 kg/m   Physical Exam:  Vital signs are reviewed.  GEN: Alert and oriented, NAD Pulm: Breathing unlabored PSY: normal mood, congruent affect  MSK: Bilateral knees: - Inspection: no gross deformity. No swelling/effusion, erythema or bruising. Skin intact - Palpation: no TTP - ROM: Patient hyperextends bilaterally approximately 15 degrees - Strength: 5/5 strength - Neuro/vasc: NV intact - Special Tests: - LIGAMENTS: positive MCL and LCL laxity without pain  Bilateral hands: No obvious deformity or swelling No tenderness Patient is significant hyperextension of the PIP and DIP joints of the second  and third digits that are are bothersome to her   ASSESSMENT & PLAN:  1.  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  Encouraged her to get the knee sleeves and finger braces when she is able.  She will continue with physical therapy and she will continue the nortriptyline is currently managed by her psychiatrist.  Continue Tylenol or Aleve as needed for intermittent incrased pain.  I did suggest she try using a Band-Aid around the DIP joints with the pad over the extensor surface to help limit some hyperextension.  She will try this.  She will follow-up in 2 to 3 months or sooner if needed  I was the preceptor for this visit and available for immediate consultation Brandi Arisimothy Ryan Draper, DO

## 2018-08-10 ENCOUNTER — Encounter: Payer: Self-pay | Admitting: Physical Therapy

## 2018-08-10 ENCOUNTER — Ambulatory Visit: Payer: BC Managed Care – PPO | Admitting: Physical Therapy

## 2018-08-10 DIAGNOSIS — M545 Low back pain, unspecified: Secondary | ICD-10-CM

## 2018-08-10 DIAGNOSIS — M25552 Pain in left hip: Secondary | ICD-10-CM

## 2018-08-10 DIAGNOSIS — M6281 Muscle weakness (generalized): Secondary | ICD-10-CM

## 2018-08-10 DIAGNOSIS — R293 Abnormal posture: Secondary | ICD-10-CM

## 2018-08-10 DIAGNOSIS — M25551 Pain in right hip: Secondary | ICD-10-CM
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
## 2018-08-14 ENCOUNTER — Encounter: Payer: Self-pay | Admitting: Physical Therapy

## 2018-08-14 ENCOUNTER — Ambulatory Visit: Payer: BC Managed Care – PPO | Admitting: Physical Therapy

## 2018-08-14 DIAGNOSIS — M545 Low back pain, unspecified: Secondary | ICD-10-CM

## 2018-08-14 DIAGNOSIS — M6281 Muscle weakness (generalized): Secondary | ICD-10-CM | POA: Diagnosis not present

## 2018-08-14 DIAGNOSIS — R293 Abnormal posture: Secondary | ICD-10-CM

## 2018-08-14 DIAGNOSIS — M25551 Pain in right hip: Secondary | ICD-10-CM

## 2018-08-14 DIAGNOSIS — M25552 Pain in left hip: Secondary | ICD-10-CM
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
## 2018-08-17 ENCOUNTER — Ambulatory Visit: Payer: BC Managed Care – PPO | Admitting: Physical Therapy

## 2018-08-17 ENCOUNTER — Encounter: Payer: Self-pay | Admitting: Physical Therapy

## 2018-08-17 DIAGNOSIS — R293 Abnormal posture: Secondary | ICD-10-CM

## 2018-08-17 DIAGNOSIS — M545 Low back pain, unspecified: Secondary | ICD-10-CM

## 2018-08-17 DIAGNOSIS — M6281 Muscle weakness (generalized): Secondary | ICD-10-CM

## 2018-08-17 DIAGNOSIS — M25552 Pain in left hip: Secondary | ICD-10-CM

## 2018-08-17 DIAGNOSIS — M25551 Pain in right hip: Secondary | ICD-10-CM
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
## 2018-08-21 ENCOUNTER — Ambulatory Visit: Payer: BC Managed Care – PPO | Admitting: Physical Therapy

## 2018-08-21 DIAGNOSIS — M545 Low back pain, unspecified: Secondary | ICD-10-CM

## 2018-08-21 DIAGNOSIS — M6281 Muscle weakness (generalized): Secondary | ICD-10-CM

## 2018-08-21 DIAGNOSIS — M25551 Pain in right hip: Secondary | ICD-10-CM

## 2018-08-21 DIAGNOSIS — M25552 Pain in left hip: Secondary | ICD-10-CM

## 2018-08-21 DIAGNOSIS — R293 Abnormal posture: Secondary | ICD-10-CM
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
## 2018-08-23 ENCOUNTER — Ambulatory Visit: Payer: BC Managed Care – PPO | Attending: Sports Medicine | Admitting: Physical Therapy

## 2018-08-23 ENCOUNTER — Encounter: Payer: Self-pay | Admitting: Physical Therapy

## 2018-08-23 DIAGNOSIS — M6281 Muscle weakness (generalized): Secondary | ICD-10-CM | POA: Diagnosis not present

## 2018-08-23 DIAGNOSIS — M25551 Pain in right hip: Secondary | ICD-10-CM | POA: Insufficient documentation

## 2018-08-23 DIAGNOSIS — M545 Low back pain, unspecified: Secondary | ICD-10-CM

## 2018-08-23 DIAGNOSIS — M25552 Pain in left hip: Secondary | ICD-10-CM | POA: Diagnosis not present

## 2018-08-23 DIAGNOSIS — R293 Abnormal posture: Secondary | ICD-10-CM | POA: Diagnosis not present
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
## 2018-08-28 ENCOUNTER — Ambulatory Visit: Payer: BC Managed Care – PPO | Admitting: Physical Therapy

## 2018-08-28 ENCOUNTER — Encounter: Payer: Self-pay | Admitting: Physical Therapy

## 2018-08-28 DIAGNOSIS — M25552 Pain in left hip: Secondary | ICD-10-CM

## 2018-08-28 DIAGNOSIS — M545 Low back pain, unspecified: Secondary | ICD-10-CM

## 2018-08-28 DIAGNOSIS — R293 Abnormal posture: Secondary | ICD-10-CM

## 2018-08-28 DIAGNOSIS — M25551 Pain in right hip: Secondary | ICD-10-CM | POA: Diagnosis not present

## 2018-08-28 DIAGNOSIS — M6281 Muscle weakness (generalized): Secondary | ICD-10-CM | POA: Diagnosis not present
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
## 2018-08-31 ENCOUNTER — Ambulatory Visit: Payer: BC Managed Care – PPO | Admitting: Physical Therapy

## 2018-08-31 ENCOUNTER — Encounter: Payer: Self-pay | Admitting: Physical Therapy

## 2018-08-31 DIAGNOSIS — M25552 Pain in left hip: Secondary | ICD-10-CM

## 2018-08-31 DIAGNOSIS — M25551 Pain in right hip: Secondary | ICD-10-CM | POA: Diagnosis not present

## 2018-08-31 DIAGNOSIS — R293 Abnormal posture: Secondary | ICD-10-CM | POA: Diagnosis not present

## 2018-08-31 DIAGNOSIS — M6281 Muscle weakness (generalized): Secondary | ICD-10-CM

## 2018-08-31 DIAGNOSIS — M545 Low back pain, unspecified: Secondary | ICD-10-CM
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
## 2018-09-04 ENCOUNTER — Ambulatory Visit: Payer: BC Managed Care – PPO | Admitting: Physical Therapy

## 2018-09-04 ENCOUNTER — Encounter: Payer: Self-pay | Admitting: Physical Therapy

## 2018-09-04 DIAGNOSIS — M6281 Muscle weakness (generalized): Secondary | ICD-10-CM

## 2018-09-04 DIAGNOSIS — M25551 Pain in right hip: Secondary | ICD-10-CM

## 2018-09-04 DIAGNOSIS — R293 Abnormal posture: Secondary | ICD-10-CM | POA: Diagnosis not present

## 2018-09-04 DIAGNOSIS — M25552 Pain in left hip: Secondary | ICD-10-CM

## 2018-09-04 DIAGNOSIS — M545 Low back pain, unspecified: Secondary | ICD-10-CM
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
## 2018-09-06 ENCOUNTER — Ambulatory Visit: Payer: BC Managed Care – PPO | Admitting: Physical Therapy

## 2018-09-06 ENCOUNTER — Encounter: Payer: Self-pay | Admitting: Physical Therapy

## 2018-09-06 DIAGNOSIS — M25552 Pain in left hip: Secondary | ICD-10-CM

## 2018-09-06 DIAGNOSIS — M545 Low back pain, unspecified: Secondary | ICD-10-CM

## 2018-09-06 DIAGNOSIS — R293 Abnormal posture: Secondary | ICD-10-CM | POA: Diagnosis not present

## 2018-09-06 DIAGNOSIS — M6281 Muscle weakness (generalized): Secondary | ICD-10-CM

## 2018-09-06 DIAGNOSIS — M25551 Pain in right hip: Secondary | ICD-10-CM
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
## 2018-09-11 ENCOUNTER — Encounter: Payer: Self-pay | Admitting: Physical Therapy

## 2018-09-11 ENCOUNTER — Ambulatory Visit: Payer: BC Managed Care – PPO | Admitting: Physical Therapy

## 2018-09-11 DIAGNOSIS — M545 Low back pain, unspecified: Secondary | ICD-10-CM

## 2018-09-11 DIAGNOSIS — M25552 Pain in left hip: Secondary | ICD-10-CM | POA: Diagnosis not present

## 2018-09-11 DIAGNOSIS — M25551 Pain in right hip: Secondary | ICD-10-CM | POA: Diagnosis not present

## 2018-09-11 DIAGNOSIS — R293 Abnormal posture: Secondary | ICD-10-CM | POA: Diagnosis not present

## 2018-09-11 DIAGNOSIS — M6281 Muscle weakness (generalized): Secondary | ICD-10-CM
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
## 2018-09-11 NOTE — Patient Instructions (Signed)

## 2018-09-14 ENCOUNTER — Ambulatory Visit: Payer: BC Managed Care – PPO | Admitting: Physical Therapy

## 2018-09-14 DIAGNOSIS — M25551 Pain in right hip: Secondary | ICD-10-CM | POA: Diagnosis not present

## 2018-09-14 DIAGNOSIS — M545 Low back pain, unspecified: Secondary | ICD-10-CM

## 2018-09-14 DIAGNOSIS — M25552 Pain in left hip: Secondary | ICD-10-CM | POA: Diagnosis not present

## 2018-09-14 DIAGNOSIS — M6281 Muscle weakness (generalized): Secondary | ICD-10-CM

## 2018-09-14 DIAGNOSIS — R293 Abnormal posture: Secondary | ICD-10-CM | POA: Diagnosis not present

## 2018-09-14 NOTE — Patient Instructions (Signed)
Step 1  Step 2  Clamshell reps: 10  sets: 2  hold: 5  daily: 1  weekly: 7 Setup  Begin lying on your side with your knees bent and your hips and shoulders stacked. Movement  Engage your abdominals and raise your top knee up toward the ceiling, then slowly return to the starting position and repeat.  Tip  Make sure to keep your core engaged and do not roll your hips forward or backward during the exercise. Step 1  Step 2  Sidelying Hip Abduction reps: 10  sets: 2  hold: 5  daily: 1  weekly: 7 Setup  Begin lying on your side with your top leg straight and your bottom leg bent. Movement  Lift your top leg up toward the ceiling, then slowly lower it back down and repeat. Tip  Make sure to keep your leg straight and do not let your hips roll backward or forward during the exercise. Step 1  Step 2  Clamshell with Resistance reps: 10  sets: 2  hold: 5  daily: 1  weekly: 7 Setup  Begin by lying on your side with your knees bent 90 degrees, hips and shoulders stacked, and a resistance loop secured around your legs.  Movement  Raise your top knee away from the bottom one, then slowly return to the starting position.  Tip  Make sure not to roll your hips forward or backward during the exercise. Step 1  Step 2  Supine 90/90 Abdominal Bracing reps: 10  sets: 2  hold: 5  daily: 1  weekly: 7 Setup  Begin lying on your back with your knees bent and feet resting flat on the floor. Movement  Tighten your abdominals and lift one leg up to a 90 degree angle, then lift your other leg to the same position and hold, keeping your abdominals tight. Then lower each foot in the same order. Tip  Make sure to continue breathing normally during the exercise and keep your low back flat against the floor. Step 1  Step 2  Step 3  Supine 90/90 Alternating Toe Touch reps: 10  sets: 2  hold: 5  daily: 1  weekly: 7 Setup  Lie on your back with your knees bent. Movement  Lift your legs off  the ground to form a 90 degree angle. Slowly lower one leg, touching your toes to the floor, then return to the starting position and repeat with the opposite leg.  Tip  Do not allow your low back to arch during the exercise.
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
## 2018-09-17 ENCOUNTER — Encounter: Payer: Self-pay | Admitting: Physical Therapy

## 2018-09-17 ENCOUNTER — Ambulatory Visit: Payer: BC Managed Care – PPO | Admitting: Physical Therapy

## 2018-09-17 DIAGNOSIS — R293 Abnormal posture: Secondary | ICD-10-CM

## 2018-09-17 DIAGNOSIS — M6281 Muscle weakness (generalized): Secondary | ICD-10-CM

## 2018-09-17 DIAGNOSIS — M25552 Pain in left hip: Secondary | ICD-10-CM | POA: Diagnosis not present

## 2018-09-17 DIAGNOSIS — M545 Low back pain, unspecified: Secondary | ICD-10-CM

## 2018-09-17 DIAGNOSIS — M25551 Pain in right hip: Secondary | ICD-10-CM
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
## 2018-09-19 ENCOUNTER — Ambulatory Visit: Payer: BC Managed Care – PPO | Admitting: Physical Therapy

## 2018-09-19 ENCOUNTER — Encounter: Payer: Self-pay | Admitting: Physical Therapy

## 2018-09-19 DIAGNOSIS — M545 Low back pain, unspecified: Secondary | ICD-10-CM

## 2018-09-19 DIAGNOSIS — R293 Abnormal posture: Secondary | ICD-10-CM

## 2018-09-19 DIAGNOSIS — M6281 Muscle weakness (generalized): Secondary | ICD-10-CM

## 2018-09-19 DIAGNOSIS — M25552 Pain in left hip: Secondary | ICD-10-CM

## 2018-09-19 DIAGNOSIS — M25551 Pain in right hip: Secondary | ICD-10-CM | POA: Diagnosis not present
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
## 2018-09-24 ENCOUNTER — Encounter: Payer: Self-pay | Admitting: Physical Therapy

## 2018-09-24 ENCOUNTER — Ambulatory Visit: Payer: BC Managed Care – PPO | Attending: Sports Medicine | Admitting: Physical Therapy

## 2018-09-24 VITALS — BP 128/82 | HR 126

## 2018-09-24 DIAGNOSIS — M25552 Pain in left hip: Secondary | ICD-10-CM | POA: Diagnosis not present

## 2018-09-24 DIAGNOSIS — M6281 Muscle weakness (generalized): Secondary | ICD-10-CM | POA: Insufficient documentation

## 2018-09-24 DIAGNOSIS — M545 Low back pain, unspecified: Secondary | ICD-10-CM

## 2018-09-24 DIAGNOSIS — M25551 Pain in right hip: Secondary | ICD-10-CM | POA: Diagnosis not present

## 2018-09-24 DIAGNOSIS — R293 Abnormal posture: Secondary | ICD-10-CM | POA: Diagnosis not present
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
## 2018-09-27 ENCOUNTER — Ambulatory Visit: Payer: BC Managed Care – PPO | Admitting: Physical Therapy

## 2018-09-27 DIAGNOSIS — R293 Abnormal posture: Secondary | ICD-10-CM

## 2018-09-27 DIAGNOSIS — M25551 Pain in right hip: Secondary | ICD-10-CM | POA: Diagnosis not present

## 2018-09-27 DIAGNOSIS — M25552 Pain in left hip: Secondary | ICD-10-CM

## 2018-09-27 DIAGNOSIS — M545 Low back pain, unspecified: Secondary | ICD-10-CM

## 2018-09-27 DIAGNOSIS — M6281 Muscle weakness (generalized): Secondary | ICD-10-CM
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
## 2018-10-08 ENCOUNTER — Encounter: Payer: Self-pay | Admitting: Physical Therapy

## 2018-10-08 ENCOUNTER — Ambulatory Visit: Payer: BC Managed Care – PPO | Admitting: Physical Therapy

## 2018-10-08 VITALS — HR 117

## 2018-10-08 DIAGNOSIS — M25551 Pain in right hip: Secondary | ICD-10-CM | POA: Diagnosis not present

## 2018-10-08 DIAGNOSIS — M25552 Pain in left hip: Secondary | ICD-10-CM

## 2018-10-08 DIAGNOSIS — M6281 Muscle weakness (generalized): Secondary | ICD-10-CM | POA: Diagnosis not present

## 2018-10-08 DIAGNOSIS — R293 Abnormal posture: Secondary | ICD-10-CM | POA: Diagnosis not present

## 2018-10-08 DIAGNOSIS — M545 Low back pain, unspecified: Secondary | ICD-10-CM
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
## 2018-10-11 ENCOUNTER — Encounter: Payer: Self-pay | Admitting: Physical Therapy

## 2018-10-11 ENCOUNTER — Ambulatory Visit (INDEPENDENT_AMBULATORY_CARE_PROVIDER_SITE_OTHER): Payer: BC Managed Care – PPO | Admitting: Sports Medicine

## 2018-10-11 ENCOUNTER — Ambulatory Visit: Payer: BC Managed Care – PPO | Admitting: Physical Therapy

## 2018-10-11 ENCOUNTER — Encounter: Payer: Self-pay | Admitting: Sports Medicine

## 2018-10-11 VITALS — BP 129/64 | Ht 69.0 in | Wt 180.0 lb

## 2018-10-11 DIAGNOSIS — M25552 Pain in left hip: Secondary | ICD-10-CM | POA: Diagnosis not present

## 2018-10-11 DIAGNOSIS — M6281 Muscle weakness (generalized): Secondary | ICD-10-CM

## 2018-10-11 DIAGNOSIS — M357 Hypermobility syndrome: Secondary | ICD-10-CM

## 2018-10-11 DIAGNOSIS — M25551 Pain in right hip: Secondary | ICD-10-CM

## 2018-10-11 DIAGNOSIS — R293 Abnormal posture: Secondary | ICD-10-CM | POA: Diagnosis not present

## 2018-10-11 DIAGNOSIS — M545 Low back pain, unspecified: Secondary | ICD-10-CM

## 2018-10-11 MED ORDER — DICLOFENAC SODIUM 75 MG PO TBEC: 75.0000 mg | DELAYED_RELEASE_TABLET | Freq: Two times a day (BID) | ORAL | 1 refills | Status: DC
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
## 2018-10-11 NOTE — Progress Notes (Signed)
  Brandi Stanley - 23 y.o. 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  She presents today with posterior left hip pain.  Patient fell off a horse on 09/16/2018.  Following this, she was able to continue riding.  She did well for 3 days immediately following the fall but the following 2 weeks she describes worsening pain over the past week she has noted some good improvement overall.  She has been using ibuprofen which is somewhat helpful.  She is also been using heat which helps.  Mild pain with walking.  No nighttime pain or pain that wakes her up from sleep.  No radiating pain.  No numbness or tingling distally.  No bowel or bladder symptoms.  She also reports ongoing chronic pain in the knees bilaterally.  She has been wearing body helix compression knee sleeves.  This has helped her right knee, however she does not know much benefit from it for the left knee.  She feels that the majority of her issues with the next knee occur with hyperextension.   ROS:     See HPI  PERTINENT  PMH / PSH FH / / SH:  Past Medical, Surgical, Social, and Family History Reviewed & Updated in the EMR.    OBJECTIVE: BP 129/64   Ht 5\' 9"  (1.753 m)   Wt 180 lb (81.6 kg)   BMI 26.58 kg/m   Physical Exam:  Vital signs are reviewed.  GEN: Alert and oriented, NAD Pulm: Breathing unlabored PSY: normal mood, congruent affect  MSK: Lumbar spine: - Inspection: no gross deformity or asymmetry, swelling or ecchymosis - Palpation: Tenderness over the soft tissue just superior to the left SI joint.  No bony tenderness - ROM: full active ROM of the lumbar spine in flexion and extension - Strength: 5/5 strength of lower extremity in L4-S1 nerve root distributions b/l - Neuro: sensation intact in the L4-S1 nerve root distribution b/l - Special testing: Negative straight leg raise  Left hip: No  obvious deformity Mild tenderness over the greater trochanter, however this is a chronic issue Full range of motion with 5/5 strength   ASSESSMENT & PLAN:  1.  Left low back pain-likely soft tissue contusion.  No neurologic symptoms.  Overall she is improving and will likely continue to progress with time -Clapacs 75 mg twice daily as needed -[address] -Consider salon poss lidocaine patches  2.  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 - Fit and provided with a hinged knee brace to limit the hyperextension of the knee.  Patient does note feeling more stable with this knee brace.  3.  Hypermobility syndrome- we will work to coordinate her to be seen in the EDS clinic [facility] the [facility] center. Of note, she does report today having a history of feeling dizziness and tachycardia upon standing. These symptoms have been less prominent lately. She did get evaluated in the past by cardiology. Her symptoms do sound suspicious for POTS/autonomic dysregulation  I was the preceptor for this visit and available for immediate consultation Marsa Aris, DO

## 2018-10-11 NOTE — Patient Instructions (Signed)
Step 1  Step 2  Single Leg Sit to Stand with Arms Crossed reps: 10  sets: 2  hold: 5  daily: 1  weekly: 4 Use the heel of the other foot to assist   Setup  Begin sitting upright in a chair. Cross your arms over your chest.  Movement  Lift one foot slightly off the floor and carefully stand up on to your other foot. Then slowly lower your self back onto the chair, and repeat. Tip  Make sure to maintain your balance during the exercise. Step 1  Step 2  Sit to Stand with Resistance Around Legs reps: 10  sets: 2  hold: 5  daily: 1  weekly: 4 Setup  Begin sitting upright with a resistance band looped around your legs. Movement  Stand up by moving your head and shoulders forward, then pushing down equally into both feet. Slowly sit back down and repeat. Tip  Make sure to maintain your balance and do not let the resistance loop pull your knees inward as you stand or sit. Try to keep your back straight throughout the exercise. Do not lock out your knees once you are standing. Step 1  Step 2  Side Stepping with Resistance at Thighs and Counter Support reps: 10  sets: 2  daily: 4  weekly: 7 Setup  Begin in a standing upright position with your hands resting on a counter in front of you and resistance band looped around your thighs. Movement  Step back and lower down into a mini squat position, then slowly step sideways along the length of the counter. When you reach the end of the counter, step in the opposite direction back to the starting position. Tip  Make sure to keep your toes pointing forward and use the counter to help you balance as needed. Do not let your feet drag on the ground or your legs collapse inward during the exercise.

## 2018-10-11 NOTE — Patient Instructions (Signed)
Diclofenac 75mg  BID as needed for pain Continue ice/heat for the back Consider salonpas lidocaine patches for the hip/back Hinged knee brace to try to prevent the hyperextension

## 2018-10-16 ENCOUNTER — Ambulatory Visit: Payer: BC Managed Care – PPO | Admitting: Physical Therapy

## 2018-10-16 DIAGNOSIS — M25552 Pain in left hip: Secondary | ICD-10-CM | POA: Diagnosis not present

## 2018-10-16 DIAGNOSIS — M545 Low back pain, unspecified: Secondary | ICD-10-CM

## 2018-10-16 DIAGNOSIS — R293 Abnormal posture: Secondary | ICD-10-CM | POA: Diagnosis not present

## 2018-10-16 DIAGNOSIS — M25551 Pain in right hip: Secondary | ICD-10-CM

## 2018-10-16 DIAGNOSIS — M6281 Muscle weakness (generalized): Secondary | ICD-10-CM
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
## 2018-10-18 ENCOUNTER — Ambulatory Visit: Payer: BC Managed Care – PPO | Admitting: Physical Therapy

## 2018-10-23 ENCOUNTER — Ambulatory Visit: Payer: BC Managed Care – PPO | Attending: Sports Medicine | Admitting: Physical Therapy

## 2018-10-23 ENCOUNTER — Encounter: Payer: Self-pay | Admitting: Physical Therapy

## 2018-10-23 DIAGNOSIS — M25551 Pain in right hip: Secondary | ICD-10-CM | POA: Diagnosis present

## 2018-10-23 DIAGNOSIS — M6281 Muscle weakness (generalized): Secondary | ICD-10-CM | POA: Insufficient documentation

## 2018-10-23 DIAGNOSIS — R293 Abnormal posture: Secondary | ICD-10-CM | POA: Diagnosis present

## 2018-10-23 DIAGNOSIS — M545 Low back pain, unspecified: Secondary | ICD-10-CM

## 2018-10-23 DIAGNOSIS — M25552 Pain in left hip: Secondary | ICD-10-CM | POA: Diagnosis present
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
## 2018-10-29 ENCOUNTER — Ambulatory Visit: Payer: BC Managed Care – PPO | Admitting: Physical Therapy

## 2018-10-29 ENCOUNTER — Encounter: Payer: Self-pay | Admitting: Physical Therapy

## 2018-10-29 DIAGNOSIS — R293 Abnormal posture: Secondary | ICD-10-CM

## 2018-10-29 DIAGNOSIS — M545 Low back pain, unspecified: Secondary | ICD-10-CM

## 2018-10-29 DIAGNOSIS — M6281 Muscle weakness (generalized): Secondary | ICD-10-CM | POA: Diagnosis not present

## 2018-10-29 DIAGNOSIS — M25551 Pain in right hip: Secondary | ICD-10-CM

## 2018-10-29 DIAGNOSIS — M25552 Pain in left hip: Secondary | ICD-10-CM
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
## 2018-10-31 ENCOUNTER — Ambulatory Visit: Payer: BC Managed Care – PPO | Admitting: Physical Therapy

## 2018-11-05 ENCOUNTER — Other Ambulatory Visit: Payer: Self-pay

## 2018-11-05 ENCOUNTER — Ambulatory Visit: Payer: BC Managed Care – PPO | Admitting: Physical Therapy

## 2018-11-05 DIAGNOSIS — M545 Low back pain, unspecified: Secondary | ICD-10-CM

## 2018-11-05 DIAGNOSIS — M6281 Muscle weakness (generalized): Secondary | ICD-10-CM | POA: Diagnosis not present

## 2018-11-05 DIAGNOSIS — M25551 Pain in right hip: Secondary | ICD-10-CM

## 2018-11-05 DIAGNOSIS — R293 Abnormal posture: Secondary | ICD-10-CM

## 2018-11-05 DIAGNOSIS — M25552 Pain in left hip: Secondary | ICD-10-CM
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
## 2018-11-07 ENCOUNTER — Other Ambulatory Visit: Payer: Self-pay

## 2018-11-07 ENCOUNTER — Ambulatory Visit: Payer: BC Managed Care – PPO | Admitting: Physical Therapy

## 2018-11-07 ENCOUNTER — Encounter: Payer: Self-pay | Admitting: Physical Therapy

## 2018-11-07 DIAGNOSIS — M545 Low back pain, unspecified: Secondary | ICD-10-CM

## 2018-11-07 DIAGNOSIS — M6281 Muscle weakness (generalized): Secondary | ICD-10-CM

## 2018-11-07 DIAGNOSIS — R293 Abnormal posture: Secondary | ICD-10-CM

## 2018-11-07 DIAGNOSIS — M25551 Pain in right hip: Secondary | ICD-10-CM

## 2018-11-07 DIAGNOSIS — M25552 Pain in left hip: Secondary | ICD-10-CM
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
## 2018-11-13 ENCOUNTER — Ambulatory Visit: Payer: BC Managed Care – PPO | Admitting: Physical Therapy

## 2018-11-20 ENCOUNTER — Ambulatory Visit: Payer: BC Managed Care – PPO | Admitting: Physical Therapy

## 2018-11-26 ENCOUNTER — Telehealth: Payer: Self-pay | Admitting: Physical Therapy

## 2018-11-26 NOTE — Telephone Encounter (Signed)
She was contacted today and left voicemail message regarding the need to cancel her appointments due to  temporary reduction of Outpatient Rehabilitation Services at [facility] due to concerns for community transmission of COVID-19.      Outpatient Rehabilitation Services at [facility] will follow up with this client when we are able to safely resume care at the clinic to all populations.    Patient made aware we can be reached by telephone during limited business hours in the meantime and that PT is now offering telehealth visits and to call our office if she is interested in these.     Ivery Quale, PT, DPT 11/26/18 10:05 AM

## 2018-11-27 ENCOUNTER — Ambulatory Visit: Payer: BC Managed Care – PPO | Admitting: Physical Therapy

## 2018-12-04 ENCOUNTER — Encounter: Payer: Self-pay | Admitting: Physical Therapy

## 2018-12-11 ENCOUNTER — Encounter: Payer: Self-pay | Admitting: Physical Therapy

## 2018-12-18 ENCOUNTER — Encounter: Payer: Self-pay | Admitting: Physical Therapy

## 2018-12-19 ENCOUNTER — Other Ambulatory Visit: Payer: Self-pay

## 2018-12-19 ENCOUNTER — Ambulatory Visit: Payer: BC Managed Care – PPO | Attending: Sports Medicine | Admitting: Physical Therapy

## 2018-12-19 DIAGNOSIS — M545 Low back pain, unspecified: Secondary | ICD-10-CM

## 2018-12-19 DIAGNOSIS — R293 Abnormal posture: Secondary | ICD-10-CM | POA: Insufficient documentation

## 2018-12-19 DIAGNOSIS — M25551 Pain in right hip: Secondary | ICD-10-CM | POA: Diagnosis present

## 2018-12-19 DIAGNOSIS — M25552 Pain in left hip: Secondary | ICD-10-CM | POA: Diagnosis present

## 2018-12-19 DIAGNOSIS — M6281 Muscle weakness (generalized): Secondary | ICD-10-CM | POA: Diagnosis present
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
## 2018-12-25 ENCOUNTER — Encounter: Payer: Self-pay | Admitting: Physical Therapy

## 2019-01-01 ENCOUNTER — Encounter: Payer: Self-pay | Admitting: Physical Therapy

## 2019-03-04 ENCOUNTER — Telehealth (HOSPITAL_BASED_OUTPATIENT_CLINIC_OR_DEPARTMENT_OTHER): Payer: BC Managed Care – PPO | Admitting: Family Medicine

## 2019-03-04 DIAGNOSIS — Z8719 Personal history of other diseases of the digestive system: Secondary | ICD-10-CM

## 2019-03-04 DIAGNOSIS — R002 Palpitations: Secondary | ICD-10-CM | POA: Diagnosis not present

## 2019-03-04 DIAGNOSIS — E785 Hyperlipidemia, unspecified: Secondary | ICD-10-CM

## 2019-03-04 DIAGNOSIS — Z91030 Bee allergy status: Secondary | ICD-10-CM

## 2019-03-04 DIAGNOSIS — M2550 Pain in unspecified joint: Secondary | ICD-10-CM

## 2019-03-04 DIAGNOSIS — Q7962 Hypermobile Ehlers-Danlos syndrome: Secondary | ICD-10-CM

## 2019-03-04 DIAGNOSIS — Z791 Long term (current) use of non-steroidal anti-inflammatories (NSAID): Secondary | ICD-10-CM

## 2019-03-04 DIAGNOSIS — Z79899 Other long term (current) drug therapy: Secondary | ICD-10-CM

## 2019-03-04 MED ORDER — EPINEPHRINE 0.3 MG/0.3ML IJ SOAJ: 0.3000 mg | INTRAMUSCULAR | 11 refills | Status: AC | PRN

## 2019-03-04 NOTE — Progress Notes (Signed)
Virtual Visit via Video Note  I connected with Brandi PieriniBrenna J Stanley on 03/04/19 at  1:50 PM EDT by a video enabled telemedicine application and verified that I am speaking with the correct person using two identifiers.  Location: Patient: Home Provider: Office at [facility]   I discussed the limitations of evaluation and management by telemedicine and the availability of in person appointments. The patient expressed understanding and agreed to proceed.  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 She was told that she has reached maximum benefit from physical therapy and is doing the exercise at home. She also has issues with anxiety and depression for which sees a psychiatrist and counselor.  She reports that even as young as age 32 or 4 she can recall having issues with joint pain but no one believed her or when she was school-age, she was told that she just had " growing pains".  Patient states that over the years she has had issues with subluxing her hips, and shoulders.  She has also had recurrent pain in her wrist right greater than left as well as her knees and lower back.  She reports that her joint pain was only recently diagnosed when she was seen by the sports medicine doctors at [facility].  She does have some current generalized joint pain but no recent injuries or subluxations.      She reports past medical history of bee sting allergies and would like to have a new EpiPen.  Her only surgery in the past has been for an emergency appendectomy.  She does report also an allergy to penicillin and Percocet.  She reports that in the past she was diagnosed with irritable bowel syndrome and does have issues with episodes of diarrhea and abdominal pain.  She is also had issues with acid reflux.  She reports that in the past  she was on medication for anxiety and depression but now feels that she can manage her symptoms with just counseling which she is currently attending.  She reports that she does have some occasional sensation of increased heart rate/palpitations and she is not sure if this is part of her anxiety disorder.  She would like to have some blood work as she has not had any recent blood work.  She reports that she was also told in the past that her cholesterol was elevated and she would like for this to be rechecked as well.   Observations/Objective: .  Patient appears to be well-nourished and well-developed and in no acute distress.  Patient demonstrates some of the joint hyper mobility associated with her Ehlers-Danlos syndrome particularly hypermobility of her shoulders, elbows and wrists during video visit.  Assessment and Plan: 1. Ehlers-Danlos syndrome type III Patient will be referred to orthopedics and further follow-up of her joint hypermobility to make sure that she does not require any interventions and for baseline exam due to her recurrent subluxations of her shoulders and hips. - AMB referral to orthopedics  2. Hyperlipidemia, unspecified hyperlipidemia type She reports a history of hyperlipidemia and patient has been encouraged to come into the office for fasting labs.  Lab order placed for lipid panel. - Lipid panel; Future  3. Encounter for long-term (current) use of medications; long-term use of nonsteroidal anti-inflammatories Patient will  have BMP, CBC in follow-up of her use of medications for treatment of joint pain and psychiatric medications though she states that she has weaned herself from most of these medications.  4. Pain in joint involving multiple sites Orthopedic referral for joint pain due to connective tissue disorder.  CBC in follow-up of use of diclofenac and nonsteroidal anti-inflammatories for joint pain. - AMB referral to orthopedics  5. Palpitations Patient is  to come into the office to have T4 and TSH as well as BMP in follow-up of her palpitations which she believes may be related to her panic attacks but she would also like to have blood work to make sure that there are no other abnormalities.  Patient will have TSH and T4 to look for thyroid abnormality such as hyperthyroidism which can cause palpitations as well as BMP to look for electrolyte abnormalities.  - T4 AND TSH; Future - Basic Metabolic Panel; Future  6. Bee sting allergy Prescription sent to patient's pharmacy for EpiPens due to bee sting allergy - EPINEPHrine (EPIPEN 2-PAK) 0.3 mg/0.3 mL IJ SOAJ injection; Inject 0.3 mLs (0.3 mg total) into the muscle as needed for anaphylaxis.  Dispense: 1 each; Refill: 11  7.  History of IBS; 8. GERD Referral placed for patient to establish with gastroenterology in follow-up of history of IBS and GERD  Follow Up Instructions:Return for fasting lab visit in 1-2 weeks.    I discussed the assessment and treatment plan with the patient. The patient was provided an opportunity to ask questions and all were answered. The patient agreed with the plan and demonstrated an understanding of the instructions.   The patient was advised to call back or seek an in-person evaluation if the symptoms worsen or if the condition fails to improve as anticipated.  I provided 15 minutes of non-face-to-face time during this encounter speaking with the patient to obtain medical information and an additional 15 minutes for review of chart, labs and placement of referrals.   Antony Blackbird, MD

## 2019-03-05 ENCOUNTER — Ambulatory Visit: Payer: Self-pay

## 2019-03-05 ENCOUNTER — Encounter: Payer: Self-pay | Admitting: Family

## 2019-03-05 ENCOUNTER — Ambulatory Visit: Payer: BC Managed Care – PPO | Admitting: Family Medicine

## 2019-03-05 ENCOUNTER — Ambulatory Visit: Payer: BC Managed Care – PPO | Attending: Family Medicine

## 2019-03-05 ENCOUNTER — Other Ambulatory Visit: Payer: Self-pay

## 2019-03-05 ENCOUNTER — Ambulatory Visit (INDEPENDENT_AMBULATORY_CARE_PROVIDER_SITE_OTHER): Payer: BC Managed Care – PPO | Admitting: Family Medicine

## 2019-03-05 DIAGNOSIS — M25551 Pain in right hip: Secondary | ICD-10-CM

## 2019-03-05 DIAGNOSIS — M545 Low back pain, unspecified: Secondary | ICD-10-CM

## 2019-03-05 DIAGNOSIS — M25552 Pain in left hip: Secondary | ICD-10-CM

## 2019-03-05 DIAGNOSIS — M25521 Pain in right elbow: Secondary | ICD-10-CM

## 2019-03-05 DIAGNOSIS — M25561 Pain in right knee: Secondary | ICD-10-CM | POA: Diagnosis not present

## 2019-03-05 DIAGNOSIS — E785 Hyperlipidemia, unspecified: Secondary | ICD-10-CM

## 2019-03-05 DIAGNOSIS — M25562 Pain in left knee: Secondary | ICD-10-CM

## 2019-03-05 DIAGNOSIS — R002 Palpitations: Secondary | ICD-10-CM

## 2019-03-05 DIAGNOSIS — G8929 Other chronic pain: Secondary | ICD-10-CM

## 2019-03-05 DIAGNOSIS — M25522 Pain in left elbow: Secondary | ICD-10-CM

## 2019-03-05 MED ORDER — NABUMETONE 750 MG PO TABS: 750.0000 mg | ORAL_TABLET | Freq: Two times a day (BID) | ORAL | 6 refills | Status: DC | PRN

## 2019-03-05 MED ORDER — DICLOFENAC SODIUM 1 % TD GEL: 4.0000 g | Freq: Four times a day (QID) | TRANSDERMAL | 6 refills | Status: AC | PRN
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
## 2019-03-06 LAB — BASIC METABOLIC PANEL WITH GFR
BUN/Creatinine Ratio: 14 (ref 9–23)
BUN: 12 mg/dL (ref 6–20)
CO2: 20 mmol/L (ref 20–29)
Calcium: 9 mg/dL (ref 8.7–10.2)
Chloride: 104 mmol/L (ref 96–106)
Creatinine, Ser: 0.85 mg/dL (ref 0.57–1.00)
GFR calc Af Amer: 113 mL/min/1.73
GFR calc non Af Amer: 98 mL/min/1.73
Glucose: 90 mg/dL (ref 65–99)
Potassium: 3.9 mmol/L (ref 3.5–5.2)
Sodium: 143 mmol/L (ref 134–144)

## 2019-03-06 LAB — LIPID PANEL
Chol/HDL Ratio: 2.9 ratio (ref 0.0–4.4)
Cholesterol, Total: 171 mg/dL (ref 100–199)
HDL: 59 mg/dL
LDL Calculated: 97 mg/dL (ref 0–99)
Triglycerides: 73 mg/dL (ref 0–149)
VLDL Cholesterol Cal: 15 mg/dL (ref 5–40)

## 2019-03-06 LAB — T4 AND TSH
T4, Total: 5.6 ug/dL (ref 4.5–12.0)
TSH: 3.98 u[IU]/mL (ref 0.450–4.500)

## 2019-03-14 ENCOUNTER — Encounter: Payer: Self-pay | Admitting: Family Medicine

## 2019-03-14 NOTE — Progress Notes (Signed)
CMA attempt to reach patient to inform on lab results.  No answer and left a VM for a call back.  

## 2019-04-08 ENCOUNTER — Other Ambulatory Visit: Payer: Self-pay

## 2019-04-08 DIAGNOSIS — Z20822 Contact with and (suspected) exposure to covid-19: Secondary | ICD-10-CM

## 2019-04-10 LAB — NOVEL CORONAVIRUS, NAA: SARS-CoV-2, NAA: NOT DETECTED

## 2019-04-15 ENCOUNTER — Encounter: Payer: Self-pay | Admitting: Family Medicine

## 2019-05-30 ENCOUNTER — Ambulatory Visit: Payer: BC Managed Care – PPO | Admitting: Family Medicine

## 2019-05-30 ENCOUNTER — Other Ambulatory Visit: Payer: Self-pay

## 2019-06-03 ENCOUNTER — Encounter: Payer: Self-pay | Admitting: Internal Medicine

## 2019-06-03 ENCOUNTER — Ambulatory Visit: Payer: BC Managed Care – PPO | Attending: Family Medicine | Admitting: Internal Medicine

## 2019-06-03 ENCOUNTER — Other Ambulatory Visit: Payer: Self-pay

## 2019-06-03 ENCOUNTER — Encounter (HOSPITAL_COMMUNITY): Payer: Self-pay | Admitting: Emergency Medicine

## 2019-06-03 ENCOUNTER — Emergency Department (HOSPITAL_COMMUNITY)
Admission: EM | Admit: 2019-06-03 | Discharge: 2019-06-04 | Disposition: A | Discharge status: Home / Self Care | Payer: BC Managed Care – PPO | Attending: Emergency Medicine | Admitting: Emergency Medicine

## 2019-06-03 VITALS — BP 160/96 | HR 143 | Temp 98.8°F | Resp 16 | Wt 177.8 lb

## 2019-06-03 DIAGNOSIS — R55 Syncope and collapse: Secondary | ICD-10-CM | POA: Insufficient documentation

## 2019-06-03 DIAGNOSIS — R42 Dizziness and giddiness: Secondary | ICD-10-CM

## 2019-06-03 DIAGNOSIS — Z5321 Procedure and treatment not carried out due to patient leaving prior to being seen by health care provider: Secondary | ICD-10-CM | POA: Insufficient documentation

## 2019-06-03 DIAGNOSIS — R200 Anesthesia of skin: Secondary | ICD-10-CM | POA: Diagnosis not present

## 2019-06-03 DIAGNOSIS — I10 Essential (primary) hypertension: Secondary | ICD-10-CM | POA: Diagnosis present

## 2019-06-03 DIAGNOSIS — R000 Tachycardia, unspecified: Secondary | ICD-10-CM

## 2019-06-03 LAB — BASIC METABOLIC PANEL
Anion gap: 13 (ref 5–15)
BUN: 10 mg/dL (ref 6–20)
CO2: 19 mmol/L — ABNORMAL LOW (ref 22–32)
Calcium: 9.6 mg/dL (ref 8.9–10.3)
Chloride: 105 mmol/L (ref 98–111)
Creatinine, Ser: 0.74 mg/dL (ref 0.44–1.00)
GFR calc Af Amer: >60 mL/min (ref >60)
GFR calc non Af Amer: >60 mL/min (ref >60)
Glucose, Bld: 94 mg/dL (ref 70–99)
Potassium: 3.8 mmol/L (ref 3.5–5.1)
Sodium: 137 mmol/L (ref 135–145)

## 2019-06-03 LAB — URINALYSIS, ROUTINE W REFLEX MICROSCOPIC
Bilirubin Urine: NEGATIVE
Glucose, UA: NEGATIVE mg/dL
Hgb urine dipstick: NEGATIVE
Ketones, ur: NEGATIVE mg/dL
Leukocytes,Ua: NEGATIVE
Nitrite: NEGATIVE
Protein, ur: NEGATIVE mg/dL
Specific Gravity, Urine: 1.016 (ref 1.005–1.030)
pH: 5 (ref 5.0–8.0)

## 2019-06-03 LAB — CBC
HCT: 45.5 % (ref 36.0–46.0)
Hemoglobin: 14.6 g/dL (ref 12.0–15.0)
MCH: 29.7 pg (ref 26.0–34.0)
MCHC: 32.1 g/dL (ref 30.0–36.0)
MCV: 92.5 fL (ref 80.0–100.0)
Platelets: 196 10*3/uL (ref 150–400)
RBC: 4.92 MIL/uL (ref 3.87–5.11)
RDW: 12.7 % (ref 11.5–15.5)
WBC: 5.9 10*3/uL (ref 4.0–10.5)
nRBC: 0 % (ref 0.0–0.2)

## 2019-06-03 LAB — I-STAT BETA HCG BLOOD, ED (MC, WL, AP ONLY): I-stat hCG, quantitative: <5 m[IU]/mL (ref <5)

## 2019-06-03 NOTE — ED Notes (Signed)
Pt..stated she had been waiting for a long time.she gonna leave and go home

## 2019-06-03 NOTE — Progress Notes (Signed)
Patient ID: Brandi Stanley, female    DOB: 26-Apr-1996  MRN: [id number]  CC: Dizziness and Numbness (leg )   Subjective: Brandi Stanley is a 23 y.o. female who presents for UC visit.  PCP is Dr. Jillyn HiddenFulp Her concerns today include:  Patient has history of Ehlers-Danlos syndrome, HL, palpitations, IBS, GERD  Pt c/o dizziness when she stands up x 4 yrs.  Associated with feeling of heart racing.  No fainting spells but has come close several times.  Not so bad in past yr but started getting getting worse in 03/2019.  Today the dizziness has been excessive.  She tells me that she stay hydrated by drinking 5 bottles of water and juice during the day.  Feels short of breath at times with the dizziness.  She tells me that she is able to check her oxygen level using an app on her phone.  She tells me that intermittently it has been low since June.  She has not had any lower extremity edema.  No recent long distance travel.  She has an IUD in place. Very active.  [place]/2019 No new meds.  Does not use any street drugs.  Saw cardiology in 2017 and wore event monitor for 2 wks. she does not recall the findings but states that he was not very concerned.  Some numbness in 1st and 2nd toes on RT  and area on RT thigh Started 03/2019.  Intermittent. Can last several hrs No recent injuries.   Patient Active Problem List   Diagnosis Date Noted  . Protein-calorie malnutrition, severe 04/13/2016  . Depression 04/10/2016  . C. difficile colitis 04/10/2016  . MDD (major depressive disorder), recurrent, severe, with psychosis ([facility]) 09/24/2015  . Severe recurrent major depression without psychotic features ([facility])   . Severe recurrent major depression with psychotic features ([facility])   . Major depressive disorder, recurrent severe without psychotic features ([facility]) 06/16/2015  . Severe recurrent major depressive disorder with psychotic features ([facility])   . Nausea and/or vomiting 05/02/2015  . PTSD  (post-traumatic stress disorder) 04/30/2015     Current Outpatient Medications on File Prior to Visit  Medication Sig Dispense Refill  . diclofenac (VOLTAREN) 75 MG EC tablet Take 1 tablet (75 mg total) by mouth 2 (two) times daily. 60 tablet 1  . diclofenac sodium (VOLTAREN) 1 % GEL Apply 4 g topically 4 (four) times daily as needed. 500 g 6  . dicyclomine (BENTYL) 10 MG capsule Take 10 mg by mouth 3 (three) times daily.    Marland Kitchen. doxazosin (CARDURA) 8 MG tablet Take 8 mg by mouth at bedtime.   2  . EPINEPHrine (EPIPEN 2-PAK) 0.3 mg/0.3 mL IJ SOAJ injection Inject 0.3 mLs (0.3 mg total) into the muscle as needed for anaphylaxis. 1 each 11  . gabapentin (NEURONTIN) 400 MG capsule 400 mg 3 (three) times daily.   2  . nabumetone (RELAFEN) 750 MG tablet Take 1 tablet (750 mg total) by mouth 2 (two) times daily as needed. 60 tablet 6  . nortriptyline (PAMELOR) 25 MG capsule Take 1 capsule (25 mg total) by mouth 3 (three) times daily. 90 capsule 1  . nortriptyline (PAMELOR) 50 MG capsule     . ondansetron (ZOFRAN) 4 MG tablet Take 1 tablet (4 mg total) by mouth every 6 (six) hours. 15 tablet 0  . sucralfate (CARAFATE) 1 GM/10ML suspension Take 1 g by mouth 4 (four) times daily -  with meals and  at bedtime.     No current facility-administered medications on file prior to visit.     Allergies  Allergen Reactions  . Bee Venom Anaphylaxis  . Penicillins Hives, Other (See Comments) and Rash    Has patient had a PCN reaction causing immediate rash, facial/tongue/throat swelling, SOB or lightheadedness with hypotension: No Has patient had a PCN reaction causing severe rash involving mucus membranes or skin necrosis: No Has patient had a PCN reaction that required hospitalization No Has patient had a PCN reaction occurring within the last 10 years: No If all of the above answers are "NO", then may proceed with Cephalosporin use.  Marland Kitchen Percocet [Oxycodone-Acetaminophen] Shortness Of Breath and Itching     Social History   Socioeconomic History  . Marital status: Single    Spouse name: Not on file  . Number of children: 0  . Years of education: 37  . Highest education level: Not on file  Occupational History  . Not on file  Social Needs  . Financial resource strain: Not on file  . Food insecurity    Worry: Not on file    Inability: Not on file  . Transportation needs    Medical: Not on file    Non-medical: Not on file  Tobacco Use  . Smoking status: Never Smoker  . Smokeless tobacco: Never Used  Substance and Sexual Activity  . Alcohol use: No  . Drug use: No  . Sexual activity: Not on file  Lifestyle  . Physical activity    Days per week: Not on file    Minutes per session: Not on file  . Stress: Not on file  Relationships  . Social Herbalist on phone: Not on file    Gets together: Not on file    Attends religious service: Not on file    Active member of club or organization: Not on file    Attends meetings of clubs or organizations: Not on file    Relationship status: Not on file  . Intimate partner violence    Fear of current or ex partner: Not on file    Emotionally abused: Not on file    Physically abused: Not on file    Forced sexual activity: Not on file  Other Topics Concern  . Not on file  Social History Narrative   Lives with mother and three siblings   Caffeine use: none   Right-handed    Family History  Problem Relation Age of Onset  . Panic disorder Maternal Grandmother   . Brain cancer Father     Past Surgical History:  Procedure Laterality Date  . APPENDECTOMY    . NO PAST SURGERIES      ROS: Review of Systems Negative except as stated above  PHYSICAL EXAM: BP (!) 160/96   Pulse (!) 143   Temp 98.8 F (37.1 C) (Oral)   Resp 16   Wt 177 lb 12.8 oz (80.6 kg)   SpO2 97%   BMI 26.26 kg/m   BP sitting 119/80 P 124; BP standing 110/70 P 145 Physical Exam  General appearance - alert, well appearing, and in no distress  Mouth -mouth appears slightly dry  chest - clear to auscultation, no wheezes, rales or rhonchi, symmetric air entry Heart -tachycardic but regular Extremity: No lower extremity edema. Neuro: Power in both lower extremities 5/5 bilaterally.  Some decreased sensation to gross touch on the lateral aspect of the right thigh.  Normal sensation in the  feet.  EKG reveals sinus tachycardia without ischemic changes CMP Latest Ref Rng & Units 03/05/2019 06/28/2018 04/20/2018  Glucose 65 - 99 mg/dL 90 86 88  BUN 6 - 20 mg/dL 12 10 14   Creatinine 0.57 - 1.00 mg/dL 9.62 2.29  Sodium 134 - 144 mmol/L 143 137 137  Potassium 3.5 - 5.2 mmol/L 3.9 3.5 3.6  Chloride 96 - 106 mmol/L 104 107 105  CO2 20 - 29 mmol/L 20 20(L) 22  Calcium 8.7 - 10.2 mg/dL 9.0 9.3 9.2  Total Protein 6.5 - 8.1 g/dL - 7.1 -  Total Bilirubin 0.3 - 1.2 mg/dL - 0.5 -  Alkaline Phos 38 - 126 U/L - 57 -  AST 15 - 41 U/L - 19 -  ALT 0 - 44 U/L - 16 -   Lipid Panel     Component Value Date/Time   CHOL 171 03/05/2019 1103   TRIG 73 03/05/2019 1103   HDL 59 03/05/2019 1103   CHOLHDL 2.9 03/05/2019 1103   CHOLHDL 5.0 09/26/2015 0630   VLDL 34 09/26/2015 0630   LDLCALC 97 03/05/2019 1103    CBC    Component Value Date/Time   WBC 6.1 06/28/2018 2111   RBC 4.56 06/28/2018 2111   HGB 13.4 06/28/2018 2111   HCT 40.1 06/28/2018 2111   PLT 165 06/28/2018 2111   MCV 87.9 06/28/2018 2111   MCH 29.4 06/28/2018 2111   MCHC 33.4 06/28/2018 2111   RDW 12.0 06/28/2018 2111   LYMPHSABS 1.3 04/20/2018 1912   MONOABS 0.6 04/20/2018 1912   EOSABS 0.0 04/20/2018 1912   BASOSABS 0.0 04/20/2018 1912    ASSESSMENT AND PLAN: 1. Orthostatic dizziness 2. Tachycardia Patient has symptomatic tachycardia with orthostatic reading by pulse.  DDX include dehydration, postural orthostatic tachycardia syndrome, medication induced in particular nortriptyline and PE though I think the latter is less likely -I recommend that she be seen in the  emergency room.  May need to be given some fluids to see whether her symptoms that are worse today improves. -Refer to cardiology -Advised to go slow with position changes -Follow-up with PCP in 2 to 3 weeks.  May consider changing the nortriptyline to a different antidepressant.  Patient tells me that she is on the nortriptyline for depression and joint aches.  Consider changing to Cymbalta.  3. Numbness in feet Check B12, A1C level on her next office visit. Symptoms in the thigh suggest some irritation of the lateral cutaneous nerve    Patient was given the opportunity to ask questions.  Patient verbalized understanding of the plan and was able to repeat key elements of the plan.   Orders Placed This Encounter  Procedures  . Ambulatory referral to Cardiology     Requested Prescriptions    No prescriptions requested or ordered in this encounter    Return in about 3 weeks (around 06/24/2019) for Fulp.  13/09/2018, MD, FACP

## 2019-06-03 NOTE — ED Triage Notes (Signed)
Patient sent by PCP due to abnormal vital signs. Reports HR of 143 and BP 160/96. Patient went to see PCP due to past 3 months having dizziness and blacking out when standing up. Patient denies any chest pain.

## 2019-06-04 ENCOUNTER — Telehealth: Payer: Self-pay | Admitting: Family Medicine

## 2019-06-04 NOTE — Telephone Encounter (Signed)
Patient called she was sent to the ER from here yesterday waited 7 hrs without being seen wants to know what todo

## 2019-06-04 NOTE — Telephone Encounter (Signed)
Will froward to Dr. Wynetta Emery

## 2019-06-04 NOTE — Telephone Encounter (Signed)
Patient was seen by [facility] yesterday and would like to know what she needs to do since she left the ED without being seen.

## 2019-06-05 NOTE — Telephone Encounter (Signed)
Contacted pt to go over Dr. Wynetta Emery response pt didn't answer left a detailed vm informing pt of message and if she has any questions or concerns to give me a call

## 2019-06-06 ENCOUNTER — Ambulatory Visit (INDEPENDENT_AMBULATORY_CARE_PROVIDER_SITE_OTHER): Payer: BC Managed Care – PPO | Admitting: Cardiology

## 2019-06-06 ENCOUNTER — Other Ambulatory Visit: Payer: Self-pay

## 2019-06-06 ENCOUNTER — Other Ambulatory Visit (HOSPITAL_COMMUNITY): Payer: BC Managed Care – PPO

## 2019-06-06 VITALS — BP 126/74 | HR 106 | Ht 69.0 in | Wt 184.2 lb

## 2019-06-06 DIAGNOSIS — R42 Dizziness and giddiness: Secondary | ICD-10-CM

## 2019-06-06 DIAGNOSIS — R002 Palpitations: Secondary | ICD-10-CM | POA: Diagnosis not present

## 2019-06-06 DIAGNOSIS — R000 Tachycardia, unspecified: Secondary | ICD-10-CM | POA: Diagnosis not present

## 2019-06-06 NOTE — Patient Instructions (Signed)
Medication Instructions:  Your Physician recommend you continue on your current medication as directed.    *If you need a refill on your cardiac medications before your next appointment, please call your pharmacy*  Lab Work: None  Testing/Procedures: Your physician has requested that you have an echocardiogram. Echocardiography is a painless test that uses sound waves to create images of your heart. It provides your doctor with information about the size and shape of your heart and how well your heart's chambers and valves are working. This procedure takes approximately one hour. There are no restrictions for this procedure. [place] [address]  Our physician has recommended that you wear an  7 DAY ZIO-PATCH monitor. The Zio patch cardiac monitor continuously records heart rhythm data for up to 14 days, this is for patients being evaluated for multiple types heart rhythms. For the first 24 hours post application, please avoid getting the Zio monitor wet in the shower or by excessive sweating during exercise. After that, feel free to carry on with regular activities. Keep soaps and lotions away from the ZIO XT Patch.   Someone will call you to have monitor mailed out to you.     Follow-Up: At [facility], you and your health needs are our priority.  As part of our continuing mission to provide you with exceptional heart care, we have created designated Provider Care Teams.  These Care Teams include your primary Cardiologist (physician) and Advanced Practice Providers (APPs -  Physician Assistants and Nurse Practitioners) who all work together to provide you with the care you need, when you need it.  Your next appointment:   1 month  The format for your next appointment:   In Person  Provider:   You may see Dr.Schumann or one of the following Advanced Practice Providers on your designated Care Team:    Rosaria Ferries, PA-C  Jory Sims, DNP, ANP  Cadence Kathlen Mody,  NP
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
## 2019-06-12 ENCOUNTER — Ambulatory Visit (HOSPITAL_COMMUNITY): Payer: BC Managed Care – PPO | Attending: Cardiology

## 2019-06-12 ENCOUNTER — Other Ambulatory Visit: Payer: Self-pay

## 2019-06-12 DIAGNOSIS — R42 Dizziness and giddiness: Secondary | ICD-10-CM | POA: Diagnosis not present

## 2019-06-12 DIAGNOSIS — R002 Palpitations: Secondary | ICD-10-CM | POA: Diagnosis not present

## 2019-06-13 ENCOUNTER — Telehealth: Payer: Self-pay | Admitting: Cardiology

## 2019-06-13 NOTE — Telephone Encounter (Signed)
Called patient, and advised of ECHO results. Patient verbalized understanding.

## 2019-06-13 NOTE — Telephone Encounter (Signed)
Follow Up:    Pt wants to know if her Echo results are ready from yesterday?

## 2019-06-14 ENCOUNTER — Telehealth: Payer: Self-pay

## 2019-06-14 NOTE — Telephone Encounter (Signed)
LM with monitor instructions. 7 day ZIO ordered.  

## 2019-06-27 ENCOUNTER — Ambulatory Visit: Payer: BC Managed Care – PPO | Attending: Family Medicine | Admitting: Family Medicine

## 2019-06-27 ENCOUNTER — Other Ambulatory Visit: Payer: Self-pay

## 2019-06-27 ENCOUNTER — Encounter: Payer: Self-pay | Admitting: Family Medicine

## 2019-06-27 DIAGNOSIS — R42 Dizziness and giddiness: Secondary | ICD-10-CM

## 2019-06-27 DIAGNOSIS — Q7962 Hypermobile Ehlers-Danlos syndrome: Secondary | ICD-10-CM

## 2019-06-27 DIAGNOSIS — M2550 Pain in unspecified joint: Secondary | ICD-10-CM | POA: Diagnosis not present

## 2019-06-27 NOTE — Progress Notes (Signed)
Patient verified DOB Patient has eaten today. Patient has not taken medication today. Patient complains of pain in the back hip and shoulders when waking up. Pain is scaled at an 4.
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
## 2019-07-10 ENCOUNTER — Encounter: Payer: Self-pay | Admitting: Cardiology

## 2019-07-10 ENCOUNTER — Other Ambulatory Visit: Payer: Self-pay

## 2019-07-10 ENCOUNTER — Ambulatory Visit (INDEPENDENT_AMBULATORY_CARE_PROVIDER_SITE_OTHER): Payer: BC Managed Care – PPO | Admitting: Cardiology

## 2019-07-10 VITALS — BP 136/79 | HR 110 | Ht 69.0 in | Wt 183.4 lb

## 2019-07-10 DIAGNOSIS — R000 Tachycardia, unspecified: Secondary | ICD-10-CM | POA: Diagnosis not present

## 2019-07-10 DIAGNOSIS — R002 Palpitations: Secondary | ICD-10-CM | POA: Diagnosis not present

## 2019-07-10 DIAGNOSIS — R42 Dizziness and giddiness: Secondary | ICD-10-CM | POA: Diagnosis not present

## 2019-07-10 NOTE — Patient Instructions (Signed)
Follow-Up: IN 3 months In Person Brandi Milian, MD.    Medication Instructions:  The current medical regimen is effective;  continue present plan and medications as directed. Please refer to the Current Medication list given to you today. If you need a refill on your cardiac medications before your next appointment, please call your pharmacy.  At [facility], you and your health needs are our priority.  As part of our continuing mission to provide you with exceptional heart care, we have created designated Provider Care Teams.  These Care Teams include your primary Cardiologist (physician) and Advanced Practice Providers (APPs -  Physician Assistants and Nurse Practitioners) who all work together to provide you with the care you need, when you need it.  Thank you for choosing [facility] at [facility]!!

## 2019-08-04 ENCOUNTER — Encounter: Payer: Self-pay | Admitting: Family Medicine

## 2019-08-07 ENCOUNTER — Ambulatory Visit: Payer: BC Managed Care – PPO | Admitting: Cardiology

## 2019-08-08 ENCOUNTER — Telehealth: Payer: Self-pay | Admitting: Radiology

## 2019-08-08 ENCOUNTER — Encounter: Payer: Self-pay | Admitting: Cardiology

## 2019-08-08 ENCOUNTER — Telehealth (INDEPENDENT_AMBULATORY_CARE_PROVIDER_SITE_OTHER): Payer: BC Managed Care – PPO | Admitting: Cardiology

## 2019-08-08 VITALS — HR 204

## 2019-08-08 DIAGNOSIS — R000 Tachycardia, unspecified: Secondary | ICD-10-CM | POA: Diagnosis not present

## 2019-08-08 DIAGNOSIS — R55 Syncope and collapse: Secondary | ICD-10-CM

## 2019-08-08 DIAGNOSIS — R42 Dizziness and giddiness: Secondary | ICD-10-CM

## 2019-08-08 DIAGNOSIS — R002 Palpitations: Secondary | ICD-10-CM

## 2019-08-08 NOTE — Addendum Note (Signed)
Addended by: Therisa Doyne on: 08/08/2019 09:50 AM   Modules accepted: Orders

## 2019-08-08 NOTE — Patient Instructions (Signed)
Medication Instructions:  Your physician recommends that you continue on your current medications as directed. Please refer to the Current Medication list given to you today.  *If you need a refill on your cardiac medications before your next appointment, please call your pharmacy*   Testing/Procedures:  ZIO XT- Long Term Monitor Instructions   Your physician has requested you wear your ZIO patch monitor__14__days.   This is a single patch monitor.  [employer] supplies one patch monitor per enrollment.  Additional stickers are not available.   Please do not apply patch if you will be having a Nuclear Stress Test, Echocardiogram, Cardiac CT, MRI, or Chest Xray during the time frame you would be wearing the monitor. The patch cannot be worn during these tests.  You cannot remove and re-apply the ZIO XT patch monitor.   Your ZIO patch monitor will be sent USPS Priority mail from [facility] directly to your home address. The monitor may also be mailed to a PO BOX if home delivery is not available.   It may take 3-5 days to receive your monitor after you have been enrolled.   Once you have received you monitor, please review enclosed instructions.  Your monitor has already been registered assigning a specific monitor serial # to you.   Applying the monitor   Shave hair from upper left chest.   Hold abrader disc by orange tab.  Rub abrader in 40 strokes over left upper chest as indicated in your monitor instructions.   Clean area with 4 enclosed alcohol pads .  Use all pads to assure are is cleaned thoroughly.  Let dry.   Apply patch as indicated in monitor instructions.  Patch will be place under collarbone on left side of chest with arrow pointing upward.   Rub patch adhesive wings for 2 minutes.Remove white label marked "1".  Remove white label marked "2".  Rub patch adhesive wings for 2 additional minutes.   While looking in a mirror, press and release button in center of patch.   A small green light will flash 3-4 times .  This will be your only indicator the monitor has been turned on.     Do not shower for the first 24 hours.  You may shower after the first 24 hours.   Press button if you feel a symptom. You will hear a small click.  Record Date, Time and Symptom in the Patient Log Book.   When you are ready to remove patch, follow instructions on last 2 pages of Patient Log Book.  Stick patch monitor onto last page of Patient Log Book.   Place Patient Log Book in [place] box.  Use locking tab on box and tape box closed securely.  The [employer] has [employer] on it.  Please place in mailbox as soon as possible.  Your physician should have your test results approximately 7 days after the monitor has been mailed back to [facility].   Call [place] at [phone] if you have questions regarding your ZIO XT patch monitor.  Call them immediately if you see an orange light blinking on your monitor.   If your monitor falls off in less than 4 days contact our Monitor department at [phone].  If your monitor becomes loose or falls off after 4 days call [employer] at [phone] for suggestions on securing your monitor.    Follow-Up: At [facility], you and your health needs are our priority.  As part of  our continuing mission to provide you with exceptional heart care, we have created designated Provider Care Teams.  These Care Teams include your primary Cardiologist (physician) and Advanced Practice Providers (APPs -  Physician Assistants and Nurse Practitioners) who all work together to provide you with the care you need, when you need it.  Please keep your scheduled follow up appointment on Tuesday, 10/08/19 at 3:00 PM OFFICE VISIT with Dr. Bjorn Pippin.

## 2019-08-08 NOTE — Telephone Encounter (Signed)
Enrolled patient for a 14 day Zio At telemetry monitor to be mailed to patients home.
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
## 2019-08-09 ENCOUNTER — Ambulatory Visit: Payer: BC Managed Care – PPO | Admitting: Cardiology

## 2019-08-09 ENCOUNTER — Telehealth: Payer: Self-pay | Admitting: Cardiology

## 2019-08-09 NOTE — Telephone Encounter (Signed)
Twyla from [employer] wanting to verify that the patients monitor has been shipped. She also stated the patient wanted her to verify if it was a XT monitor or AT monitor. Please Advise.

## 2019-08-12 NOTE — Telephone Encounter (Signed)
Called Twyla from [employer] to verify monitor order.   Dr. Gardiner Rhyme ordered a Long term monitor-Live Telemetry, ( ZIO -AT patch).  The patient was enrolled 08/08/19  with the diagnosis of syncope.

## 2019-08-16 ENCOUNTER — Ambulatory Visit (INDEPENDENT_AMBULATORY_CARE_PROVIDER_SITE_OTHER): Payer: BC Managed Care – PPO

## 2019-08-16 DIAGNOSIS — R55 Syncope and collapse: Secondary | ICD-10-CM

## 2019-09-09 ENCOUNTER — Emergency Department (HOSPITAL_COMMUNITY)
Admission: EM | Admit: 2019-09-09 | Discharge: 2019-09-09 | Disposition: A | Discharge status: Home / Self Care | Payer: BC Managed Care – PPO | Attending: Emergency Medicine | Admitting: Emergency Medicine

## 2019-09-09 ENCOUNTER — Emergency Department (HOSPITAL_COMMUNITY): Payer: BC Managed Care – PPO

## 2019-09-09 ENCOUNTER — Other Ambulatory Visit: Payer: Self-pay

## 2019-09-09 ENCOUNTER — Encounter (HOSPITAL_COMMUNITY): Payer: Self-pay | Admitting: Emergency Medicine

## 2019-09-09 DIAGNOSIS — Y9389 Activity, other specified: Secondary | ICD-10-CM | POA: Insufficient documentation

## 2019-09-09 DIAGNOSIS — M25551 Pain in right hip: Secondary | ICD-10-CM | POA: Insufficient documentation

## 2019-09-09 DIAGNOSIS — S8991XA Unspecified injury of right lower leg, initial encounter: Secondary | ICD-10-CM | POA: Diagnosis not present

## 2019-09-09 DIAGNOSIS — Y92410 Unspecified street and highway as the place of occurrence of the external cause: Secondary | ICD-10-CM | POA: Insufficient documentation

## 2019-09-09 DIAGNOSIS — M25561 Pain in right knee: Secondary | ICD-10-CM | POA: Insufficient documentation

## 2019-09-09 DIAGNOSIS — Y999 Unspecified external cause status: Secondary | ICD-10-CM | POA: Diagnosis not present

## 2019-09-09 DIAGNOSIS — Q796 Ehlers-Danlos syndrome, unspecified: Secondary | ICD-10-CM | POA: Insufficient documentation

## 2019-09-09 DIAGNOSIS — S79911A Unspecified injury of right hip, initial encounter: Secondary | ICD-10-CM | POA: Diagnosis not present

## 2019-09-09 HISTORY — DX: Tuberculosis of spine: A18.01

## 2019-09-09 HISTORY — DX: Ehlers-Danlos syndrome, unspecified: Q79.60

## 2019-09-09 MED ORDER — CYCLOBENZAPRINE HCL 10 MG PO TABS: 10.0000 mg | ORAL_TABLET | Freq: Every day | ORAL | 0 refills | Status: DC

## 2019-09-09 MED ORDER — CYCLOBENZAPRINE HCL 10 MG PO TABS: 10.0000 mg | ORAL_TABLET | Freq: Every day | ORAL | 0 refills | Status: AC

## 2019-09-09 MED ORDER — IBUPROFEN 800 MG PO TABS: 800.0000 mg | ORAL_TABLET | Freq: Three times a day (TID) | ORAL | 0 refills | Status: DC
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
## 2019-09-09 NOTE — Discharge Instructions (Addendum)
Your x-rays today demonstrate no acute bony abnormalities.  Please take your medications, as prescribed. You were given a prescription for Flexeril which is a muscle relaxer.  You should not drive, work, consume alcohol, or operate machinery while taking this medication as it can make you very drowsy.    Please follow-up with Dr. Dion Saucier in orthopedics, particularly should your symptoms fail to improve with conservative therapy.  You may require further imaging and evaluation.

## 2019-09-30 ENCOUNTER — Other Ambulatory Visit: Payer: Self-pay

## 2019-09-30 ENCOUNTER — Ambulatory Visit (INDEPENDENT_AMBULATORY_CARE_PROVIDER_SITE_OTHER): Payer: BC Managed Care – PPO | Admitting: Family Medicine

## 2019-09-30 ENCOUNTER — Encounter: Payer: Self-pay | Admitting: Family Medicine

## 2019-09-30 DIAGNOSIS — M545 Low back pain, unspecified: Secondary | ICD-10-CM

## 2019-09-30 DIAGNOSIS — M25561 Pain in right knee: Secondary | ICD-10-CM | POA: Diagnosis not present
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
## 2019-10-08 ENCOUNTER — Ambulatory Visit: Payer: BC Managed Care – PPO | Admitting: Cardiology
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
## 2019-10-22 ENCOUNTER — Encounter: Payer: Self-pay | Admitting: Physical Therapy

## 2019-10-22 ENCOUNTER — Other Ambulatory Visit: Payer: Self-pay

## 2019-10-22 ENCOUNTER — Ambulatory Visit: Payer: BC Managed Care – PPO | Attending: Family Medicine | Admitting: Physical Therapy

## 2019-10-22 DIAGNOSIS — M545 Low back pain, unspecified: Secondary | ICD-10-CM

## 2019-10-22 DIAGNOSIS — M6281 Muscle weakness (generalized): Secondary | ICD-10-CM | POA: Insufficient documentation

## 2019-10-22 DIAGNOSIS — M25551 Pain in right hip: Secondary | ICD-10-CM | POA: Diagnosis not present

## 2019-10-22 DIAGNOSIS — M25552 Pain in left hip: Secondary | ICD-10-CM | POA: Insufficient documentation

## 2019-10-22 DIAGNOSIS — M25561 Pain in right knee: Secondary | ICD-10-CM | POA: Diagnosis not present

## 2019-10-22 DIAGNOSIS — R293 Abnormal posture: Secondary | ICD-10-CM | POA: Diagnosis not present

## 2019-10-22 DIAGNOSIS — M357 Hypermobility syndrome: Secondary | ICD-10-CM | POA: Insufficient documentation
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
## 2019-10-28 ENCOUNTER — Other Ambulatory Visit: Payer: Self-pay

## 2019-10-28 ENCOUNTER — Encounter: Payer: Self-pay | Admitting: Physical Therapy

## 2019-10-28 ENCOUNTER — Ambulatory Visit: Payer: BC Managed Care – PPO | Admitting: Physical Therapy

## 2019-10-28 DIAGNOSIS — M357 Hypermobility syndrome: Secondary | ICD-10-CM | POA: Diagnosis not present

## 2019-10-28 DIAGNOSIS — R293 Abnormal posture: Secondary | ICD-10-CM

## 2019-10-28 DIAGNOSIS — M545 Low back pain, unspecified: Secondary | ICD-10-CM

## 2019-10-28 DIAGNOSIS — M25561 Pain in right knee: Secondary | ICD-10-CM

## 2019-10-28 DIAGNOSIS — M25551 Pain in right hip: Secondary | ICD-10-CM | POA: Diagnosis not present

## 2019-10-28 DIAGNOSIS — M6281 Muscle weakness (generalized): Secondary | ICD-10-CM
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
## 2019-10-29 ENCOUNTER — Encounter: Payer: Self-pay | Admitting: Family Medicine

## 2019-10-29 ENCOUNTER — Ambulatory Visit (INDEPENDENT_AMBULATORY_CARE_PROVIDER_SITE_OTHER): Payer: BC Managed Care – PPO | Admitting: Family Medicine

## 2019-10-29 DIAGNOSIS — M545 Low back pain, unspecified: Secondary | ICD-10-CM

## 2019-10-29 DIAGNOSIS — M25561 Pain in right knee: Secondary | ICD-10-CM | POA: Diagnosis not present

## 2019-10-29 DIAGNOSIS — M79642 Pain in left hand: Secondary | ICD-10-CM | POA: Diagnosis not present

## 2019-10-29 DIAGNOSIS — M79641 Pain in right hand: Secondary | ICD-10-CM | POA: Diagnosis not present
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
## 2019-10-30 ENCOUNTER — Encounter: Payer: Self-pay | Admitting: Physical Therapy

## 2019-10-30 ENCOUNTER — Other Ambulatory Visit: Payer: Self-pay

## 2019-10-30 ENCOUNTER — Ambulatory Visit: Payer: BC Managed Care – PPO | Admitting: Physical Therapy

## 2019-10-30 DIAGNOSIS — M6281 Muscle weakness (generalized): Secondary | ICD-10-CM | POA: Diagnosis not present

## 2019-10-30 DIAGNOSIS — M357 Hypermobility syndrome: Secondary | ICD-10-CM | POA: Diagnosis not present

## 2019-10-30 DIAGNOSIS — M545 Low back pain, unspecified: Secondary | ICD-10-CM

## 2019-10-30 DIAGNOSIS — M25551 Pain in right hip: Secondary | ICD-10-CM | POA: Diagnosis not present

## 2019-10-30 DIAGNOSIS — M25561 Pain in right knee: Secondary | ICD-10-CM | POA: Diagnosis not present

## 2019-10-30 DIAGNOSIS — R293 Abnormal posture: Secondary | ICD-10-CM | POA: Diagnosis not present
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
## 2019-11-05 ENCOUNTER — Encounter: Payer: Self-pay | Admitting: Physical Therapy

## 2019-11-05 ENCOUNTER — Other Ambulatory Visit: Payer: Self-pay

## 2019-11-05 ENCOUNTER — Ambulatory Visit: Payer: BC Managed Care – PPO | Admitting: Physical Therapy

## 2019-11-05 DIAGNOSIS — M357 Hypermobility syndrome: Secondary | ICD-10-CM

## 2019-11-05 DIAGNOSIS — R293 Abnormal posture: Secondary | ICD-10-CM | POA: Diagnosis not present

## 2019-11-05 DIAGNOSIS — M545 Low back pain, unspecified: Secondary | ICD-10-CM

## 2019-11-05 DIAGNOSIS — M25561 Pain in right knee: Secondary | ICD-10-CM | POA: Diagnosis not present

## 2019-11-05 DIAGNOSIS — M25551 Pain in right hip: Secondary | ICD-10-CM | POA: Diagnosis not present

## 2019-11-05 DIAGNOSIS — M6281 Muscle weakness (generalized): Secondary | ICD-10-CM | POA: Diagnosis not present

## 2019-11-05 NOTE — Patient Instructions (Signed)
Access Code: [email or web address]: [email or web address]: 03/16/2021Prepared by: Victorino Dike PaaExercises  Supine Bridge with Resistance Band - 1 x daily - 7 x weekly - 2 sets - 10 reps - 5 hold  Butterfly Bridge - 1 x daily - 7 x weekly - 2 sets - 10 reps  Shoulder Bridge Prep with Continuous Marching - 1 x daily - 7 x weekly - 2 sets - 10 reps  Small Range Straight Leg Raise - 1 x daily - 7 x weekly - 2 sets - 10 reps - 5 hold
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
## 2019-11-07 ENCOUNTER — Other Ambulatory Visit: Payer: Self-pay

## 2019-11-07 ENCOUNTER — Encounter: Payer: Self-pay | Admitting: Physical Therapy

## 2019-11-07 ENCOUNTER — Ambulatory Visit: Payer: BC Managed Care – PPO | Admitting: Physical Therapy

## 2019-11-07 DIAGNOSIS — M25561 Pain in right knee: Secondary | ICD-10-CM

## 2019-11-07 DIAGNOSIS — M6281 Muscle weakness (generalized): Secondary | ICD-10-CM

## 2019-11-07 DIAGNOSIS — M25552 Pain in left hip: Secondary | ICD-10-CM

## 2019-11-07 DIAGNOSIS — R293 Abnormal posture: Secondary | ICD-10-CM

## 2019-11-07 DIAGNOSIS — M545 Low back pain, unspecified: Secondary | ICD-10-CM

## 2019-11-07 DIAGNOSIS — M357 Hypermobility syndrome: Secondary | ICD-10-CM

## 2019-11-07 DIAGNOSIS — M25551 Pain in right hip: Secondary | ICD-10-CM | POA: Diagnosis not present
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
## 2019-11-11 ENCOUNTER — Encounter: Payer: Self-pay | Admitting: Cardiology

## 2019-11-12 ENCOUNTER — Other Ambulatory Visit: Payer: Self-pay

## 2019-11-12 ENCOUNTER — Ambulatory Visit: Payer: BC Managed Care – PPO | Admitting: Physical Therapy

## 2019-11-12 ENCOUNTER — Encounter: Payer: Self-pay | Admitting: Physical Therapy

## 2019-11-12 DIAGNOSIS — M25551 Pain in right hip: Secondary | ICD-10-CM

## 2019-11-12 DIAGNOSIS — M357 Hypermobility syndrome: Secondary | ICD-10-CM | POA: Diagnosis not present

## 2019-11-12 DIAGNOSIS — R293 Abnormal posture: Secondary | ICD-10-CM

## 2019-11-12 DIAGNOSIS — M6281 Muscle weakness (generalized): Secondary | ICD-10-CM

## 2019-11-12 DIAGNOSIS — M25561 Pain in right knee: Secondary | ICD-10-CM

## 2019-11-12 DIAGNOSIS — M545 Low back pain, unspecified: Secondary | ICD-10-CM

## 2019-11-12 DIAGNOSIS — M25552 Pain in left hip: Secondary | ICD-10-CM
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
## 2019-11-14 ENCOUNTER — Ambulatory Visit: Payer: BC Managed Care – PPO | Admitting: Physical Therapy

## 2019-11-19 ENCOUNTER — Encounter: Payer: Self-pay | Admitting: Physical Therapy

## 2019-11-19 ENCOUNTER — Telehealth: Payer: Self-pay | Admitting: Physical Therapy

## 2019-11-19 ENCOUNTER — Ambulatory Visit: Payer: BC Managed Care – PPO | Admitting: Physical Therapy

## 2019-11-19 NOTE — Telephone Encounter (Signed)
Unable to reach patient by phone.  She cancelled her last week's appt and no showed today.  Will follow up and try calling her later this week.   Karie Mainland, PT 11/19/19 2:25 PM Phone: [phone] Fax: [phone]

## 2019-11-21 ENCOUNTER — Encounter: Payer: Self-pay | Admitting: Physical Therapy

## 2019-11-21 ENCOUNTER — Ambulatory Visit: Payer: BC Managed Care – PPO | Attending: Family Medicine | Admitting: Physical Therapy

## 2019-11-21 ENCOUNTER — Other Ambulatory Visit: Payer: Self-pay

## 2019-11-21 DIAGNOSIS — M6281 Muscle weakness (generalized): Secondary | ICD-10-CM | POA: Diagnosis present

## 2019-11-21 DIAGNOSIS — M25551 Pain in right hip: Secondary | ICD-10-CM | POA: Insufficient documentation

## 2019-11-21 DIAGNOSIS — M545 Low back pain, unspecified: Secondary | ICD-10-CM

## 2019-11-21 DIAGNOSIS — M357 Hypermobility syndrome: Secondary | ICD-10-CM | POA: Insufficient documentation

## 2019-11-21 DIAGNOSIS — R293 Abnormal posture: Secondary | ICD-10-CM | POA: Diagnosis present

## 2019-11-21 DIAGNOSIS — M25552 Pain in left hip: Secondary | ICD-10-CM | POA: Insufficient documentation

## 2019-11-21 DIAGNOSIS — M25561 Pain in right knee: Secondary | ICD-10-CM | POA: Insufficient documentation

## 2019-11-21 NOTE — Telephone Encounter (Signed)
[employer] has not received monitor back from patient. They have attempted to contact the patient on 3 occasions to request monitor be returned to [facility].  ORDER CANCELLED
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
## 2019-11-25 ENCOUNTER — Ambulatory Visit: Payer: BC Managed Care – PPO | Admitting: Cardiology

## 2019-12-03 ENCOUNTER — Ambulatory Visit: Payer: BC Managed Care – PPO | Admitting: Physical Therapy

## 2019-12-06 ENCOUNTER — Ambulatory Visit: Payer: BC Managed Care – PPO | Admitting: Cardiology

## 2019-12-10 ENCOUNTER — Encounter: Payer: BC Managed Care – PPO | Admitting: Physical Therapy

## 2019-12-17 ENCOUNTER — Ambulatory Visit: Payer: BC Managed Care – PPO | Admitting: Physical Therapy

## 2019-12-17 ENCOUNTER — Encounter: Payer: Self-pay | Admitting: Physical Therapy

## 2019-12-17 ENCOUNTER — Other Ambulatory Visit: Payer: Self-pay

## 2019-12-17 DIAGNOSIS — R293 Abnormal posture: Secondary | ICD-10-CM | POA: Diagnosis not present

## 2019-12-17 DIAGNOSIS — M25551 Pain in right hip: Secondary | ICD-10-CM

## 2019-12-17 DIAGNOSIS — M357 Hypermobility syndrome: Secondary | ICD-10-CM

## 2019-12-17 DIAGNOSIS — M25552 Pain in left hip: Secondary | ICD-10-CM

## 2019-12-17 DIAGNOSIS — M6281 Muscle weakness (generalized): Secondary | ICD-10-CM

## 2019-12-17 DIAGNOSIS — M545 Low back pain, unspecified: Secondary | ICD-10-CM

## 2019-12-17 DIAGNOSIS — M25561 Pain in right knee: Secondary | ICD-10-CM
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
## 2019-12-18 ENCOUNTER — Ambulatory Visit (INDEPENDENT_AMBULATORY_CARE_PROVIDER_SITE_OTHER): Payer: BC Managed Care – PPO | Admitting: Cardiology

## 2019-12-18 ENCOUNTER — Encounter: Payer: Self-pay | Admitting: Cardiology

## 2019-12-18 VITALS — BP 130/85 | HR 114 | Temp 97.4°F | Ht 69.0 in | Wt 185.2 lb

## 2019-12-18 DIAGNOSIS — R42 Dizziness and giddiness: Secondary | ICD-10-CM

## 2019-12-18 DIAGNOSIS — R002 Palpitations: Secondary | ICD-10-CM | POA: Diagnosis not present

## 2019-12-18 DIAGNOSIS — R000 Tachycardia, unspecified: Secondary | ICD-10-CM

## 2019-12-18 DIAGNOSIS — R55 Syncope and collapse: Secondary | ICD-10-CM
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
## 2019-12-18 NOTE — Patient Instructions (Signed)
Medication Instructions:  Your physician recommends that you continue on your current medications as directed. Please refer to the Current Medication list given to you today.  Follow-Up: At [facility], you and your health needs are our priority.  As part of our continuing mission to provide you with exceptional heart care, we have created designated Provider Care Teams.  These Care Teams include your primary Cardiologist (physician) and Advanced Practice Providers (APPs -  Physician Assistants and Nurse Practitioners) who all work together to provide you with the care you need, when you need it.  We recommend signing up for the patient portal called "MyChart".  Sign up information is provided on this After Visit Summary.  MyChart is used to connect with patients for Virtual Visits (Telemedicine).  Patients are able to view lab/test results, encounter notes, upcoming appointments, etc.  Non-urgent messages can be sent to your provider as well.   To learn more about what you can do with MyChart, go to [email or web address].    Your next appointment:   6 month(s)  The format for your next appointment:   In Person  Provider:   Christopher Schumann, MD     

## 2019-12-19 ENCOUNTER — Ambulatory Visit: Payer: BC Managed Care – PPO | Admitting: Physical Therapy

## 2019-12-24 ENCOUNTER — Ambulatory Visit: Payer: BC Managed Care – PPO | Attending: Family Medicine | Admitting: Physical Therapy

## 2019-12-24 ENCOUNTER — Other Ambulatory Visit: Payer: Self-pay

## 2019-12-24 VITALS — HR 112

## 2019-12-24 DIAGNOSIS — M25561 Pain in right knee: Secondary | ICD-10-CM | POA: Insufficient documentation

## 2019-12-24 DIAGNOSIS — M25551 Pain in right hip: Secondary | ICD-10-CM | POA: Diagnosis present

## 2019-12-24 DIAGNOSIS — R293 Abnormal posture: Secondary | ICD-10-CM | POA: Diagnosis not present

## 2019-12-24 DIAGNOSIS — M545 Low back pain, unspecified: Secondary | ICD-10-CM

## 2019-12-24 DIAGNOSIS — M25552 Pain in left hip: Secondary | ICD-10-CM | POA: Insufficient documentation

## 2019-12-24 DIAGNOSIS — M6281 Muscle weakness (generalized): Secondary | ICD-10-CM | POA: Insufficient documentation

## 2019-12-24 DIAGNOSIS — M357 Hypermobility syndrome: Secondary | ICD-10-CM | POA: Diagnosis present
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
## 2019-12-26 ENCOUNTER — Other Ambulatory Visit: Payer: Self-pay

## 2019-12-26 ENCOUNTER — Ambulatory Visit: Payer: BC Managed Care – PPO | Admitting: Physical Therapy

## 2019-12-26 ENCOUNTER — Encounter: Payer: Self-pay | Admitting: Physical Therapy

## 2019-12-26 DIAGNOSIS — M357 Hypermobility syndrome: Secondary | ICD-10-CM

## 2019-12-26 DIAGNOSIS — M6281 Muscle weakness (generalized): Secondary | ICD-10-CM

## 2019-12-26 DIAGNOSIS — M25552 Pain in left hip: Secondary | ICD-10-CM

## 2019-12-26 DIAGNOSIS — R293 Abnormal posture: Secondary | ICD-10-CM | POA: Diagnosis not present

## 2019-12-26 DIAGNOSIS — M25551 Pain in right hip: Secondary | ICD-10-CM

## 2019-12-26 DIAGNOSIS — M545 Low back pain, unspecified: Secondary | ICD-10-CM

## 2019-12-26 DIAGNOSIS — M25561 Pain in right knee: Secondary | ICD-10-CM
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
## 2019-12-31 ENCOUNTER — Other Ambulatory Visit: Payer: Self-pay

## 2019-12-31 ENCOUNTER — Ambulatory Visit: Payer: BC Managed Care – PPO | Admitting: Physical Therapy

## 2019-12-31 ENCOUNTER — Encounter: Payer: Self-pay | Admitting: Physical Therapy

## 2019-12-31 DIAGNOSIS — M25551 Pain in right hip: Secondary | ICD-10-CM

## 2019-12-31 DIAGNOSIS — M545 Low back pain, unspecified: Secondary | ICD-10-CM

## 2019-12-31 DIAGNOSIS — M357 Hypermobility syndrome: Secondary | ICD-10-CM

## 2019-12-31 DIAGNOSIS — M25561 Pain in right knee: Secondary | ICD-10-CM

## 2019-12-31 DIAGNOSIS — R293 Abnormal posture: Secondary | ICD-10-CM

## 2019-12-31 DIAGNOSIS — M25552 Pain in left hip: Secondary | ICD-10-CM

## 2019-12-31 DIAGNOSIS — M6281 Muscle weakness (generalized): Secondary | ICD-10-CM
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
## 2020-01-02 ENCOUNTER — Encounter: Payer: Self-pay | Admitting: Physical Therapy

## 2020-01-02 ENCOUNTER — Ambulatory Visit: Payer: BC Managed Care – PPO | Admitting: Physical Therapy

## 2020-01-02 ENCOUNTER — Other Ambulatory Visit: Payer: Self-pay

## 2020-01-02 DIAGNOSIS — M25561 Pain in right knee: Secondary | ICD-10-CM

## 2020-01-02 DIAGNOSIS — M25552 Pain in left hip: Secondary | ICD-10-CM

## 2020-01-02 DIAGNOSIS — M545 Low back pain, unspecified: Secondary | ICD-10-CM

## 2020-01-02 DIAGNOSIS — R293 Abnormal posture: Secondary | ICD-10-CM | POA: Diagnosis not present

## 2020-01-02 DIAGNOSIS — M357 Hypermobility syndrome: Secondary | ICD-10-CM

## 2020-01-02 DIAGNOSIS — M25551 Pain in right hip: Secondary | ICD-10-CM

## 2020-01-02 DIAGNOSIS — M6281 Muscle weakness (generalized): Secondary | ICD-10-CM
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
## 2020-01-07 ENCOUNTER — Ambulatory Visit: Payer: BC Managed Care – PPO | Admitting: Physical Therapy

## 2020-01-09 ENCOUNTER — Ambulatory Visit: Payer: BC Managed Care – PPO | Admitting: Physical Therapy

## 2020-01-14 ENCOUNTER — Other Ambulatory Visit: Payer: Self-pay

## 2020-01-14 ENCOUNTER — Encounter: Payer: Self-pay | Admitting: Physical Therapy

## 2020-01-14 ENCOUNTER — Ambulatory Visit: Payer: BC Managed Care – PPO | Admitting: Physical Therapy

## 2020-01-14 DIAGNOSIS — M25552 Pain in left hip: Secondary | ICD-10-CM

## 2020-01-14 DIAGNOSIS — M6281 Muscle weakness (generalized): Secondary | ICD-10-CM

## 2020-01-14 DIAGNOSIS — M357 Hypermobility syndrome: Secondary | ICD-10-CM

## 2020-01-14 DIAGNOSIS — M25561 Pain in right knee: Secondary | ICD-10-CM

## 2020-01-14 DIAGNOSIS — M25551 Pain in right hip: Secondary | ICD-10-CM

## 2020-01-14 DIAGNOSIS — R293 Abnormal posture: Secondary | ICD-10-CM

## 2020-01-14 DIAGNOSIS — M545 Low back pain, unspecified: Secondary | ICD-10-CM
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
## 2020-01-16 ENCOUNTER — Ambulatory Visit: Payer: BC Managed Care – PPO | Admitting: Physical Therapy

## 2020-01-28 ENCOUNTER — Other Ambulatory Visit: Payer: Self-pay

## 2020-01-28 ENCOUNTER — Encounter: Payer: Self-pay | Admitting: Physical Therapy

## 2020-01-28 ENCOUNTER — Ambulatory Visit: Payer: BC Managed Care – PPO | Attending: Family Medicine | Admitting: Physical Therapy

## 2020-01-28 DIAGNOSIS — M357 Hypermobility syndrome: Secondary | ICD-10-CM | POA: Insufficient documentation

## 2020-01-28 DIAGNOSIS — R293 Abnormal posture: Secondary | ICD-10-CM | POA: Diagnosis present

## 2020-01-28 DIAGNOSIS — M25552 Pain in left hip: Secondary | ICD-10-CM | POA: Diagnosis present

## 2020-01-28 DIAGNOSIS — M6281 Muscle weakness (generalized): Secondary | ICD-10-CM | POA: Diagnosis present

## 2020-01-28 DIAGNOSIS — M25561 Pain in right knee: Secondary | ICD-10-CM | POA: Insufficient documentation

## 2020-01-28 DIAGNOSIS — M545 Low back pain, unspecified: Secondary | ICD-10-CM

## 2020-01-28 DIAGNOSIS — M25551 Pain in right hip: Secondary | ICD-10-CM | POA: Insufficient documentation

## 2020-01-28 NOTE — Patient Instructions (Signed)
Instructed to perform hip abduction and clam as she has not been doing these lately.  Also given info on Pilates studio classes for long term program, fitness.
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
## 2020-01-30 ENCOUNTER — Ambulatory Visit: Payer: BC Managed Care – PPO | Admitting: Physical Therapy

## 2020-02-04 ENCOUNTER — Encounter: Payer: BC Managed Care – PPO | Admitting: Physical Therapy

## 2020-02-06 ENCOUNTER — Encounter: Payer: BC Managed Care – PPO | Admitting: Physical Therapy

## 2020-02-11 ENCOUNTER — Encounter: Payer: BC Managed Care – PPO | Admitting: Physical Therapy

## 2020-02-13 ENCOUNTER — Encounter: Payer: BC Managed Care – PPO | Admitting: Physical Therapy

## 2020-02-18 ENCOUNTER — Encounter: Payer: BC Managed Care – PPO | Admitting: Physical Therapy

## 2020-02-20 ENCOUNTER — Encounter: Payer: BC Managed Care – PPO | Admitting: Physical Therapy

## 2020-04-15 DIAGNOSIS — Z20828 Contact with and (suspected) exposure to other viral communicable diseases: Secondary | ICD-10-CM | POA: Diagnosis not present
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
## 2020-06-15 ENCOUNTER — Ambulatory Visit: Payer: BC Managed Care – PPO | Admitting: Cardiology
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
## 2020-07-23 ENCOUNTER — Encounter: Payer: Self-pay | Admitting: Cardiology

## 2020-07-23 ENCOUNTER — Other Ambulatory Visit: Payer: Self-pay

## 2020-07-23 ENCOUNTER — Ambulatory Visit (INDEPENDENT_AMBULATORY_CARE_PROVIDER_SITE_OTHER): Payer: BC Managed Care – PPO | Admitting: Cardiology

## 2020-07-23 VITALS — BP 108/67 | HR 80 | Ht 69.0 in | Wt 179.6 lb

## 2020-07-23 DIAGNOSIS — R55 Syncope and collapse: Secondary | ICD-10-CM

## 2020-07-23 DIAGNOSIS — R42 Dizziness and giddiness: Secondary | ICD-10-CM | POA: Diagnosis not present

## 2020-07-23 DIAGNOSIS — R002 Palpitations: Secondary | ICD-10-CM | POA: Diagnosis not present

## 2020-07-23 DIAGNOSIS — R000 Tachycardia, unspecified: Secondary | ICD-10-CM

## 2020-07-23 NOTE — Patient Instructions (Signed)
Medication Instructions:  No Changes In Medications at this time.  *If you need a refill on your cardiac medications before your next appointment, please call your pharmacy*  Follow-Up: At [facility], you and your health needs are our priority.  As part of our continuing mission to provide you with exceptional heart care, we have created designated Provider Care Teams.  These Care Teams include your primary Cardiologist (physician) and Advanced Practice Providers (APPs -  Physician Assistants and Nurse Practitioners) who all work together to provide you with the care you need, when you need it.   Your next appointment:   1 year(s)  The format for your next appointment:   In Person  Provider:   Christopher Schumann, MD  

## 2020-10-07 IMAGING — CR DG TIBIA/FIBULA 2V*R*
4 series · 4 of 4 positions shown · non-contrast
Comparison: None.

CLINICAL DATA: Self-inflicted laceration.

EXAM:
RIGHT TIBIA AND FIBULA - 2 VIEW

[tibia ap (1 of 2)]
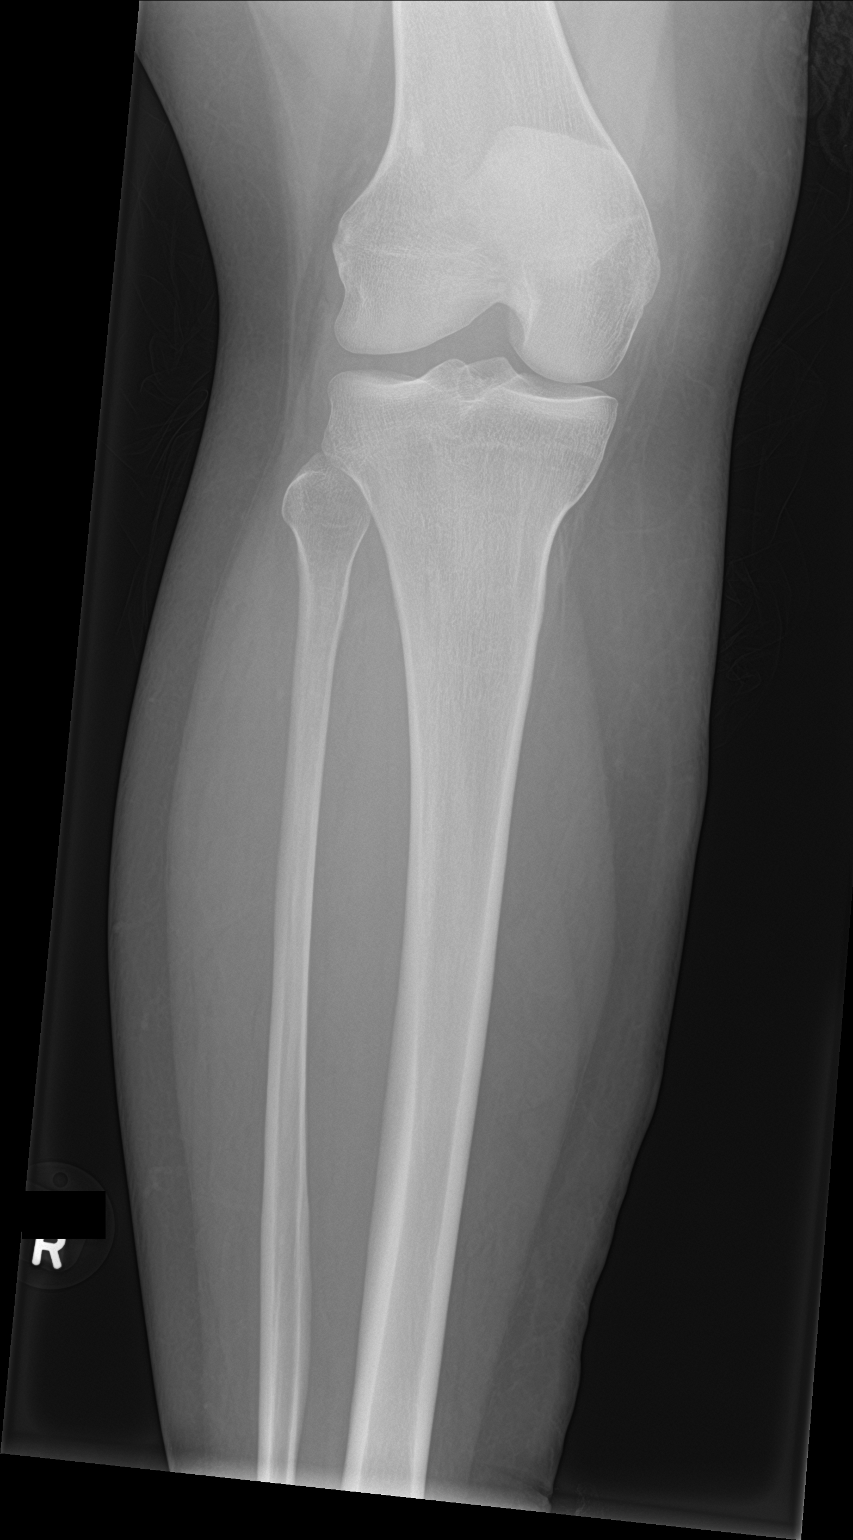

[tibia ap (2 of 2)]
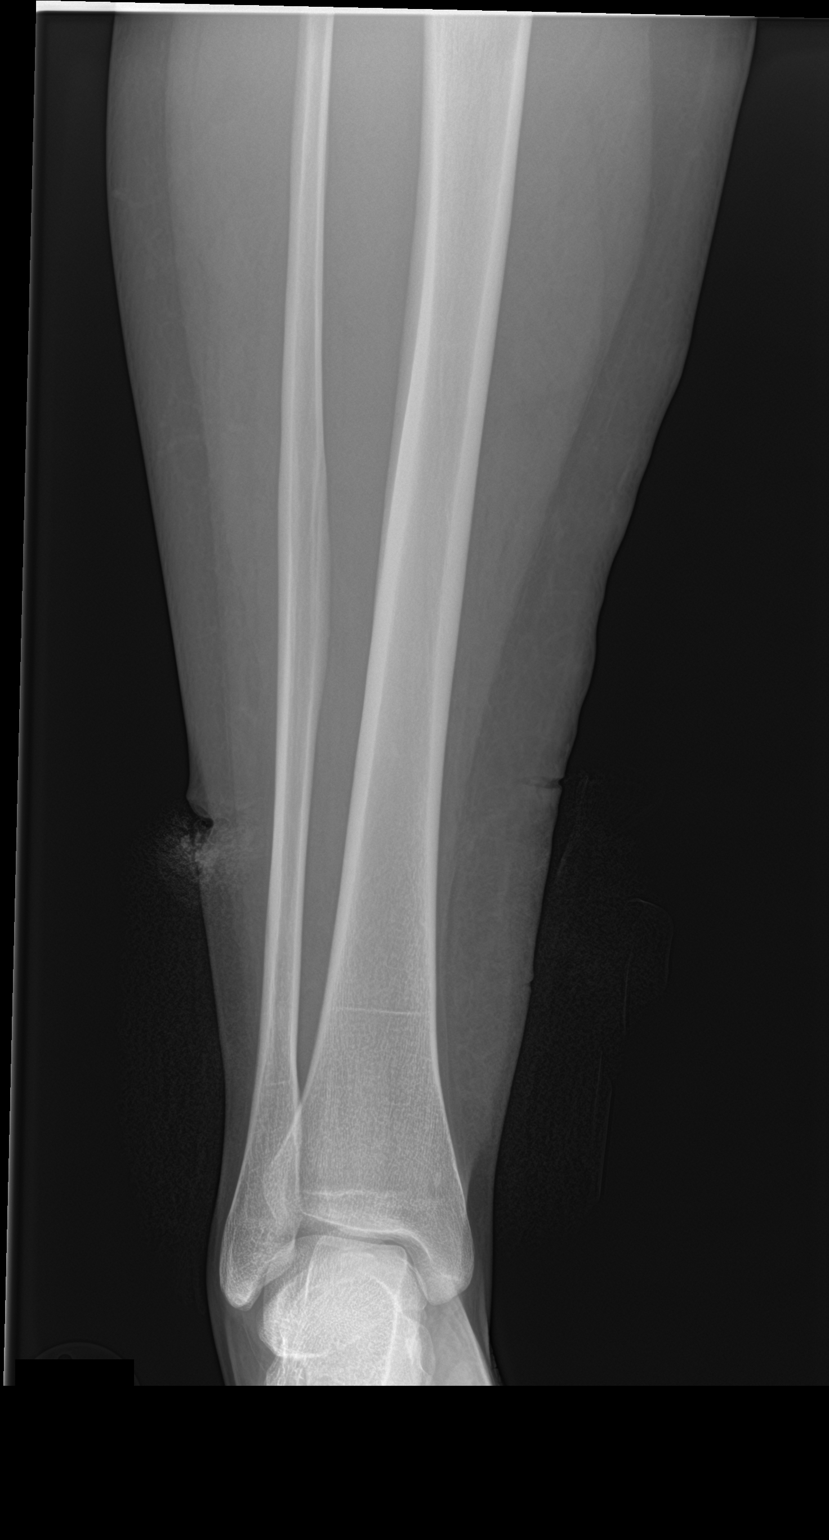

[tibia lat (1 of 2)]
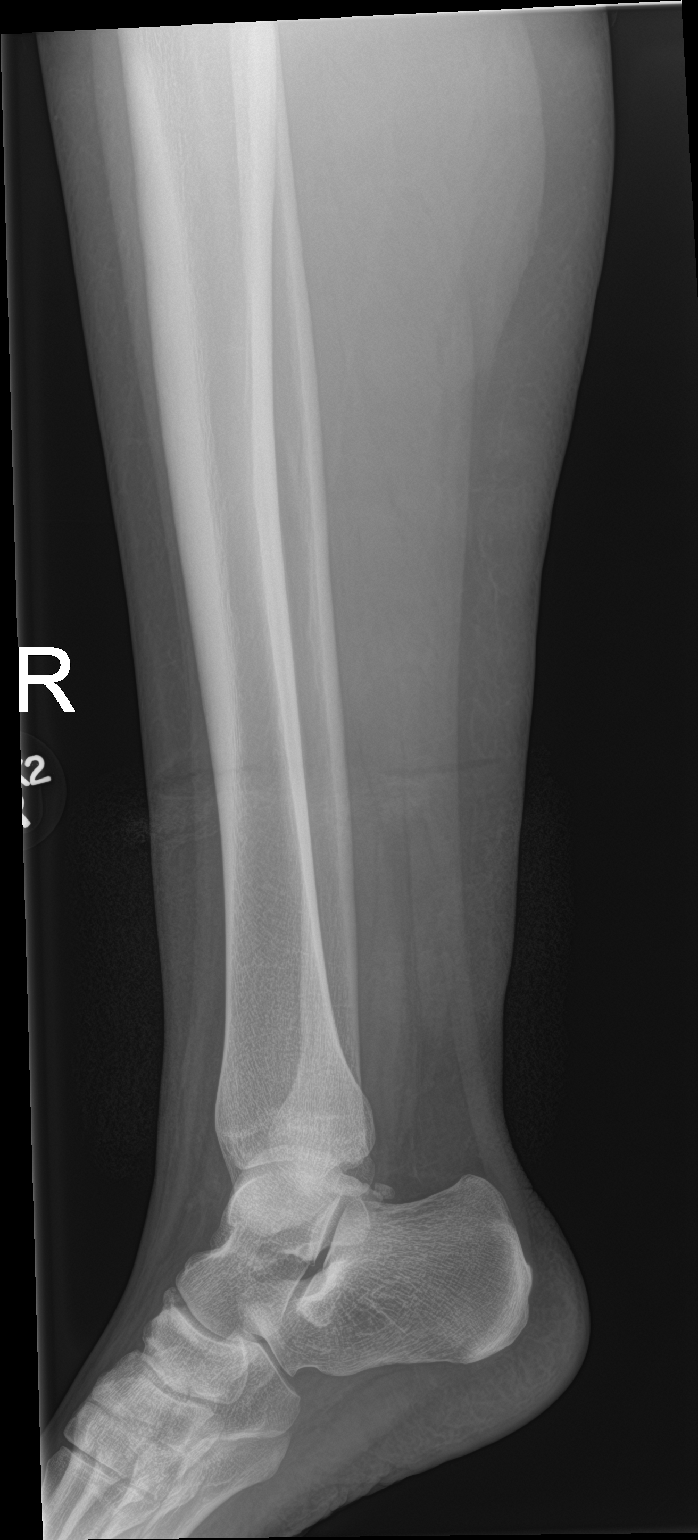

[tibia lat (2 of 2)]
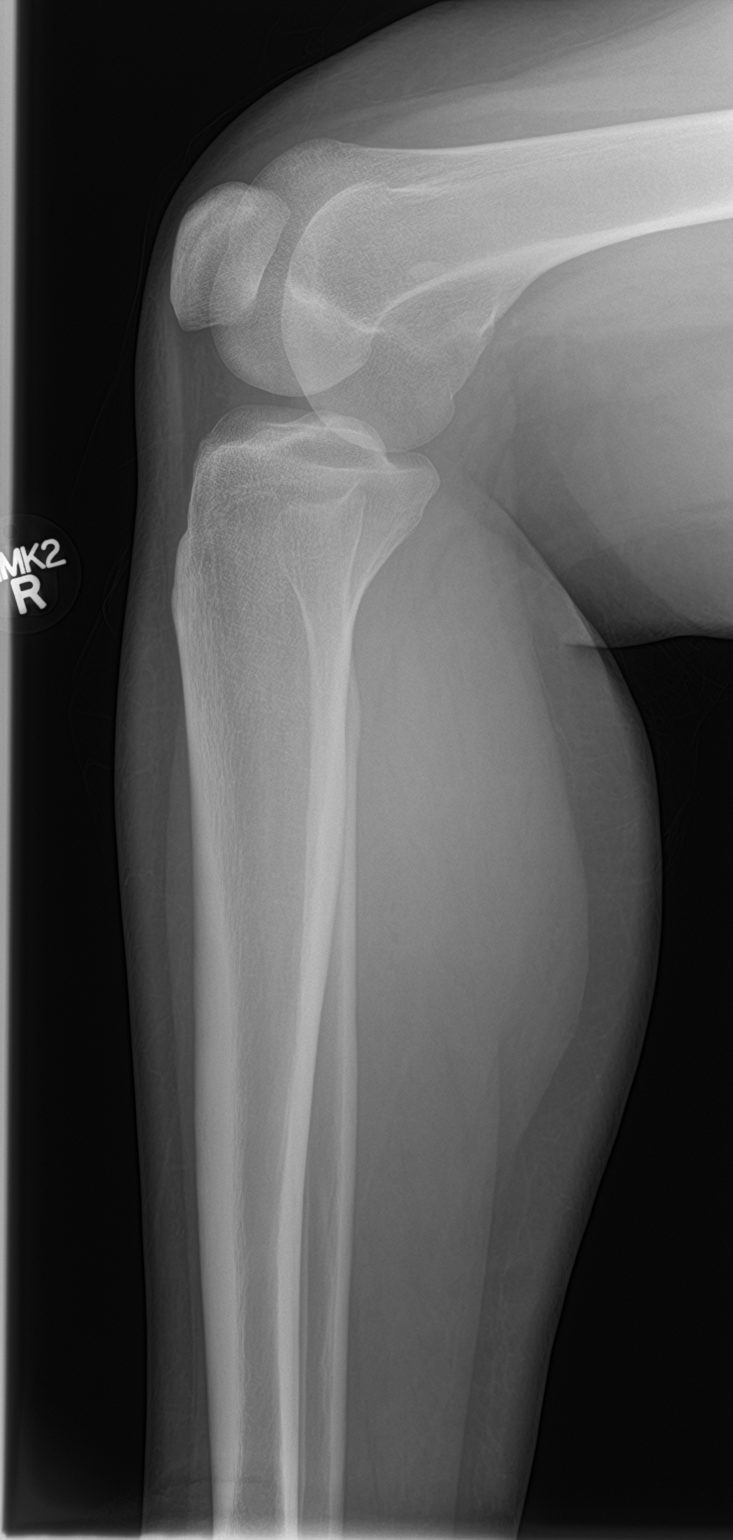

[4 of 4 positions shown; findings below may reference images not displayed]

FINDINGS: There is no evidence of fracture or other focal bone lesions. Focal
skin defect distal fibular and tibial soft tissues with focal
subcutaneous gas. No radiopaque foreign bodies.
IMPRESSION: Distal tibia and fibula lacerations without radiopaque foreign
bodies or acute osseous process.

## 2020-11-19 DIAGNOSIS — R443 Hallucinations, unspecified: Secondary | ICD-10-CM | POA: Diagnosis not present

## 2020-11-19 DIAGNOSIS — Z9151 Personal history of suicidal behavior: Secondary | ICD-10-CM | POA: Diagnosis not present

## 2020-11-19 DIAGNOSIS — Z8719 Personal history of other diseases of the digestive system: Secondary | ICD-10-CM | POA: Diagnosis not present

## 2020-11-19 DIAGNOSIS — Z9089 Acquired absence of other organs: Secondary | ICD-10-CM | POA: Diagnosis not present

## 2020-11-19 DIAGNOSIS — R451 Restlessness and agitation: Secondary | ICD-10-CM | POA: Diagnosis not present

## 2020-11-19 DIAGNOSIS — F332 Major depressive disorder, recurrent severe without psychotic features: Secondary | ICD-10-CM | POA: Diagnosis not present

## 2020-11-19 DIAGNOSIS — F4310 Post-traumatic stress disorder, unspecified: Secondary | ICD-10-CM | POA: Diagnosis not present

## 2020-11-19 DIAGNOSIS — G8929 Other chronic pain: Secondary | ICD-10-CM | POA: Diagnosis not present

## 2020-11-19 DIAGNOSIS — I498 Other specified cardiac arrhythmias: Secondary | ICD-10-CM | POA: Diagnosis not present

## 2020-11-19 DIAGNOSIS — R45851 Suicidal ideations: Secondary | ICD-10-CM | POA: Diagnosis not present

## 2021-02-20 DIAGNOSIS — F332 Major depressive disorder, recurrent severe without psychotic features: Secondary | ICD-10-CM | POA: Diagnosis not present

## 2021-02-20 DIAGNOSIS — F4310 Post-traumatic stress disorder, unspecified: Secondary | ICD-10-CM | POA: Diagnosis not present

## 2021-02-22 DIAGNOSIS — F4310 Post-traumatic stress disorder, unspecified: Secondary | ICD-10-CM | POA: Diagnosis not present

## 2021-02-22 DIAGNOSIS — F332 Major depressive disorder, recurrent severe without psychotic features: Secondary | ICD-10-CM | POA: Diagnosis not present

## 2021-02-23 DIAGNOSIS — F332 Major depressive disorder, recurrent severe without psychotic features: Secondary | ICD-10-CM | POA: Diagnosis not present

## 2021-02-23 DIAGNOSIS — F4310 Post-traumatic stress disorder, unspecified: Secondary | ICD-10-CM | POA: Diagnosis not present

## 2021-02-24 DIAGNOSIS — F332 Major depressive disorder, recurrent severe without psychotic features: Secondary | ICD-10-CM | POA: Diagnosis not present

## 2021-02-24 DIAGNOSIS — F4310 Post-traumatic stress disorder, unspecified: Secondary | ICD-10-CM | POA: Diagnosis not present

## 2021-02-25 DIAGNOSIS — F4310 Post-traumatic stress disorder, unspecified: Secondary | ICD-10-CM | POA: Diagnosis not present

## 2021-02-25 DIAGNOSIS — F332 Major depressive disorder, recurrent severe without psychotic features: Secondary | ICD-10-CM | POA: Diagnosis not present

## 2021-02-26 DIAGNOSIS — F4310 Post-traumatic stress disorder, unspecified: Secondary | ICD-10-CM | POA: Diagnosis not present

## 2021-02-26 DIAGNOSIS — F332 Major depressive disorder, recurrent severe without psychotic features: Secondary | ICD-10-CM | POA: Diagnosis not present

## 2021-03-01 DIAGNOSIS — F332 Major depressive disorder, recurrent severe without psychotic features: Secondary | ICD-10-CM | POA: Diagnosis not present

## 2021-03-01 DIAGNOSIS — F4310 Post-traumatic stress disorder, unspecified: Secondary | ICD-10-CM | POA: Diagnosis not present

## 2021-03-02 DIAGNOSIS — F4310 Post-traumatic stress disorder, unspecified: Secondary | ICD-10-CM | POA: Diagnosis not present

## 2021-03-02 DIAGNOSIS — F332 Major depressive disorder, recurrent severe without psychotic features: Secondary | ICD-10-CM | POA: Diagnosis not present

## 2021-03-03 DIAGNOSIS — F4310 Post-traumatic stress disorder, unspecified: Secondary | ICD-10-CM | POA: Diagnosis not present

## 2021-03-03 DIAGNOSIS — F332 Major depressive disorder, recurrent severe without psychotic features: Secondary | ICD-10-CM | POA: Diagnosis not present

## 2021-03-04 DIAGNOSIS — F332 Major depressive disorder, recurrent severe without psychotic features: Secondary | ICD-10-CM | POA: Diagnosis not present

## 2021-03-04 DIAGNOSIS — F4310 Post-traumatic stress disorder, unspecified: Secondary | ICD-10-CM | POA: Diagnosis not present

## 2021-03-05 DIAGNOSIS — F332 Major depressive disorder, recurrent severe without psychotic features: Secondary | ICD-10-CM | POA: Diagnosis not present

## 2021-03-05 DIAGNOSIS — F4310 Post-traumatic stress disorder, unspecified: Secondary | ICD-10-CM | POA: Diagnosis not present

## 2021-03-08 DIAGNOSIS — F4310 Post-traumatic stress disorder, unspecified: Secondary | ICD-10-CM | POA: Diagnosis not present

## 2021-03-08 DIAGNOSIS — F332 Major depressive disorder, recurrent severe without psychotic features: Secondary | ICD-10-CM | POA: Diagnosis not present

## 2021-03-09 DIAGNOSIS — F4310 Post-traumatic stress disorder, unspecified: Secondary | ICD-10-CM | POA: Diagnosis not present

## 2021-03-09 DIAGNOSIS — F332 Major depressive disorder, recurrent severe without psychotic features: Secondary | ICD-10-CM | POA: Diagnosis not present

## 2021-03-10 DIAGNOSIS — F332 Major depressive disorder, recurrent severe without psychotic features: Secondary | ICD-10-CM | POA: Diagnosis not present

## 2021-03-10 DIAGNOSIS — F4310 Post-traumatic stress disorder, unspecified: Secondary | ICD-10-CM | POA: Diagnosis not present

## 2021-03-11 DIAGNOSIS — F332 Major depressive disorder, recurrent severe without psychotic features: Secondary | ICD-10-CM | POA: Diagnosis not present

## 2021-03-11 DIAGNOSIS — F4310 Post-traumatic stress disorder, unspecified: Secondary | ICD-10-CM | POA: Diagnosis not present

## 2021-03-12 DIAGNOSIS — F332 Major depressive disorder, recurrent severe without psychotic features: Secondary | ICD-10-CM | POA: Diagnosis not present

## 2021-03-12 DIAGNOSIS — F4310 Post-traumatic stress disorder, unspecified: Secondary | ICD-10-CM | POA: Diagnosis not present

## 2021-03-15 DIAGNOSIS — F4310 Post-traumatic stress disorder, unspecified: Secondary | ICD-10-CM | POA: Diagnosis not present

## 2021-03-15 DIAGNOSIS — F332 Major depressive disorder, recurrent severe without psychotic features: Secondary | ICD-10-CM | POA: Diagnosis not present

## 2021-03-16 DIAGNOSIS — F332 Major depressive disorder, recurrent severe without psychotic features: Secondary | ICD-10-CM | POA: Diagnosis not present

## 2021-03-16 DIAGNOSIS — F4310 Post-traumatic stress disorder, unspecified: Secondary | ICD-10-CM | POA: Diagnosis not present

## 2021-03-17 DIAGNOSIS — F332 Major depressive disorder, recurrent severe without psychotic features: Secondary | ICD-10-CM | POA: Diagnosis not present

## 2021-03-17 DIAGNOSIS — F4310 Post-traumatic stress disorder, unspecified: Secondary | ICD-10-CM | POA: Diagnosis not present

## 2021-11-08 ENCOUNTER — Emergency Department (HOSPITAL_COMMUNITY): Payer: BC Managed Care – PPO

## 2021-11-08 ENCOUNTER — Other Ambulatory Visit: Payer: Self-pay

## 2021-11-08 ENCOUNTER — Emergency Department (HOSPITAL_COMMUNITY)
Admission: EM | Admit: 2021-11-08 | Discharge: 2021-11-10 | Disposition: A | Discharge status: Other Acute Inpatient Hospital | Payer: BC Managed Care – PPO | Attending: Emergency Medicine | Admitting: Emergency Medicine

## 2021-11-08 ENCOUNTER — Encounter (HOSPITAL_COMMUNITY): Payer: Self-pay

## 2021-11-08 DIAGNOSIS — X789XXA Intentional self-harm by unspecified sharp object, initial encounter: Secondary | ICD-10-CM | POA: Diagnosis not present

## 2021-11-08 DIAGNOSIS — R443 Hallucinations, unspecified: Secondary | ICD-10-CM | POA: Diagnosis not present

## 2021-11-08 DIAGNOSIS — S81812A Laceration without foreign body, left lower leg, initial encounter: Secondary | ICD-10-CM | POA: Insufficient documentation

## 2021-11-08 DIAGNOSIS — Z7289 Other problems related to lifestyle: Secondary | ICD-10-CM

## 2021-11-08 DIAGNOSIS — R45851 Suicidal ideations: Secondary | ICD-10-CM

## 2021-11-08 DIAGNOSIS — S4992XA Unspecified injury of left shoulder and upper arm, initial encounter: Secondary | ICD-10-CM | POA: Diagnosis present

## 2021-11-08 DIAGNOSIS — F32A Depression, unspecified: Secondary | ICD-10-CM | POA: Insufficient documentation

## 2021-11-08 DIAGNOSIS — S41112A Laceration without foreign body of left upper arm, initial encounter: Secondary | ICD-10-CM | POA: Insufficient documentation

## 2021-11-08 DIAGNOSIS — Z20822 Contact with and (suspected) exposure to covid-19: Secondary | ICD-10-CM | POA: Insufficient documentation

## 2021-11-08 DIAGNOSIS — T07XXXA Unspecified multiple injuries, initial encounter: Secondary | ICD-10-CM

## 2021-11-08 LAB — I-STAT BETA HCG BLOOD, ED (MC, WL, AP ONLY): I-stat hCG, quantitative: <5 m[IU]/mL (ref <5)

## 2021-11-08 LAB — COMPREHENSIVE METABOLIC PANEL
ALT: 21 U/L (ref 0–44)
AST: 19 U/L (ref 15–41)
Albumin: 4.3 g/dL (ref 3.5–5.0)
Alkaline Phosphatase: 58 U/L (ref 38–126)
Anion gap: 9 (ref 5–15)
BUN: 11 mg/dL (ref 6–20)
CO2: 24 mmol/L (ref 22–32)
Calcium: 9.4 mg/dL (ref 8.9–10.3)
Chloride: 104 mmol/L (ref 98–111)
Creatinine, Ser: 0.95 mg/dL (ref 0.44–1.00)
GFR, Estimated: >60 mL/min (ref >60)
Glucose, Bld: 99 mg/dL (ref 70–99)
Potassium: 3.4 mmol/L — ABNORMAL LOW (ref 3.5–5.1)
Sodium: 137 mmol/L (ref 135–145)
Total Bilirubin: 0.8 mg/dL (ref 0.3–1.2)
Total Protein: 7 g/dL (ref 6.5–8.1)

## 2021-11-08 LAB — RAPID URINE DRUG SCREEN, HOSP PERFORMED
Amphetamines: NOT DETECTED
Barbiturates: NOT DETECTED
Benzodiazepines: POSITIVE — AB
Cocaine: NOT DETECTED
Opiates: NOT DETECTED
Tetrahydrocannabinol: NOT DETECTED

## 2021-11-08 LAB — CBC
HCT: 40.9 % (ref 36.0–46.0)
Hemoglobin: 13.4 g/dL (ref 12.0–15.0)
MCH: 30.3 pg (ref 26.0–34.0)
MCHC: 32.8 g/dL (ref 30.0–36.0)
MCV: 92.5 fL (ref 80.0–100.0)
Platelets: 195 10*3/uL (ref 150–400)
RBC: 4.42 MIL/uL (ref 3.87–5.11)
RDW: 13.2 % (ref 11.5–15.5)
WBC: 6.8 10*3/uL (ref 4.0–10.5)
nRBC: 0 % (ref 0.0–0.2)

## 2021-11-08 LAB — ETHANOL: Alcohol, Ethyl (B): <10 mg/dL (ref <10)

## 2021-11-08 NOTE — ED Provider Triage Note (Signed)
Emergency Medicine Provider Triage Evaluation Note ? ?Brandi Stanley , a 26 y.o. female  was evaluated in triage.  Pt complains of suicide attempt prior to arrival.  She notes that her friend found her in the bathtub.  Patient has associated lacerations to left forearm and left lower leg.  Per patient chart review patient's tetanus was in 2019. She notes that she has had some symptoms for the past. Pt with active SI at this time.  Denies HI, visual/auditory hallucinations at this time. ? ?Review of Systems  ?Positive: As per HPI above ?Negative: ? ?Physical Exam  ?There were no vitals taken for this visit. ?Gen:   Awake, no distress  ?Resp:  Normal effort  ?MSK:   Moves extremities without difficulty  ?Other:  12 cm laceration noted to anterior left forearm with muscle exposed.  Capillary refill less than 2 seconds.  Finger to thumb opposition intact.  Sensation intact to left upper extremity.  Sick centimeter laceration noted to anterior left shin. ? ?Medical Decision Making  ?Medically screening exam initiated at 9:32 PM.  Appropriate orders placed.  RIN GORTON was informed that the remainder of the evaluation will be completed by another provider, this initial triage assessment does not replace that evaluation, and the importance of remaining in the ED until their evaluation is complete.  ? ?9:45 PM - Pt evaluated by attending, Dr. Posey Rea who agrees with IVC at this time. IVC paperwork completed. ?  ?Agapito Hanway A, PA-C ?11/08/21 2216 ? ?

## 2021-11-08 NOTE — ED Provider Notes (Signed)
?[place] ?Provider Note ? ? ?CSN: [id number] ?Arrival date & time: 11/08/21  2130 ? ?  ? ?History ? ?Chief Complaint  ?Patient presents with  ? Suicide Attempt  ? ? ?Brandi Stanley is a 26 y.o. female with a tree of anxiety, PTSD, depression, and Ehlers-Danlos syndrome presents to the emergency department with complaints of suicidal ideation/intent.  Patient states she has been depressed with SI for very long time, states today she tried to harm herself by cutting her left arm and left leg with a razor.  Denies other areas of injury or other attempts at self-harm and did not try to overdose on anything.  No alleviating or aggravating factors.  Denies numbness, tingling, weakness.  Admits to hallucinations, denies HI.  She is right-hand dominant. ?HPI ? ?  ? ?Home Medications ?Prior to Admission medications   ?Medication Sig Start Date End Date Taking? Authorizing Provider  ?clomiPRAMINE (ANAFRANIL) 50 MG capsule Take 50 mg by mouth every morning. 07/15/20   [provider]  ?diclofenac (VOLTAREN) 75 MG EC tablet Take 1 tablet (75 mg total) by mouth 2 (two) times daily. 10/11/18   Coralyn Helling, DO  ?diclofenac sodium (VOLTAREN) 1 % GEL Apply 4 g topically 4 (four) times daily as needed. 03/05/19   Hilts, Legrand Como, MD  ?dicyclomine (BENTYL) 10 MG capsule Take 10 mg by mouth 3 (three) times daily. 11/01/16   [provider]  ?EPINEPHrine (EPIPEN 2-PAK) 0.3 mg/0.3 mL IJ SOAJ injection Inject 0.3 mLs (0.3 mg total) into the muscle as needed for anaphylaxis. 03/04/19   Fulp, Cammie, MD  ?ibuprofen (ADVIL) 800 MG tablet Take 1 tablet (800 mg total) by mouth 3 (three) times daily. 09/09/19   Corena Herter, PA-C  ?levonorgestrel (MIRENA, 52 MG,) 20 MCG/24HR IUD Mirena 20 mcg/24 hours (7 yrs) 52 mg intrauterine device ? Take by intrauterine route.    [provider]  ?LORazepam (ATIVAN) 0.5 MG tablet Ativan 0.5 mg tablet ? Take 2 tablets 3 times a day by oral  route. 04/22/18   [provider]  ?omeprazole (PRILOSEC) 10 MG capsule omeprazole 10 mg capsule,delayed release ? Take 2 capsules every day by oral route.    [provider]  ?ondansetron (ZOFRAN) 4 MG tablet Take 1 tablet (4 mg total) by mouth every 6 (six) hours. 04/20/18   Hayden Rasmussen, MD  ?REXULTI 1 MG TABS tablet Take 1 mg by mouth daily. 07/03/20   [provider]  ?   ? ?Allergies    ?Bee venom, Penicillins, and Percocet [oxycodone-acetaminophen]   ? ?Review of Systems   ?Review of Systems  ?Constitutional:  Negative for chills and fever.  ?Respiratory:  Negative for shortness of breath.   ?Cardiovascular:  Negative for chest pain.  ?Skin:  Positive for wound.  ?Neurological:  Negative for weakness and numbness.  ?Psychiatric/Behavioral:  Positive for hallucinations, self-injury and suicidal ideas.   ?All other systems reviewed and are negative. ? ?Physical Exam ?Updated Vital Signs ?BP (!) 141/72 (BP Location: Right Arm)   Pulse 75   Temp 98.9 ?F (37.2 ?C) (Oral)   Resp 19   SpO2 100%  ?Physical Exam ?Vitals and nursing note reviewed.  ?Constitutional:   ?   General: She is not in acute distress. ?   Appearance: She is well-developed. She is not toxic-appearing.  ?HENT:  ?   Head: Normocephalic and atraumatic.  ?Eyes:  ?   General:     ?  Right eye: No discharge.     ?   Left eye: No discharge.  ?   Conjunctiva/sclera: Conjunctivae normal.  ?Cardiovascular:  ?   Rate and Rhythm: Normal rate and regular rhythm.  ?   Comments: 2+ radial, DP, and PT pulses bilaterally.  ?Pulmonary:  ?   Effort: No respiratory distress.  ?   Breath sounds: Normal breath sounds. No wheezing or rales.  ?Abdominal:  ?   General: There is no distension.  ?   Palpations: Abdomen is soft.  ?   Tenderness: There is no abdominal tenderness. There is no guarding or rebound.  ?Musculoskeletal:  ?   Cervical back: Neck supple.  ?   Comments: Left upper extremity: There is a 13 cm diameter laceration to  the antecubital fossa that is approximately 6 to 7 mm deep.  There is no active bleeding.  No visible tendon injury or muscle belly injury.  No visible foreign bodies.  Patient is able to actively range the shoulder, elbow, wrist, and all digits without limitations.  She is able to flex/extend the elbow specifically against resistance.  Tender to palpation directly over wound however otherwise nontender.  Compartments are soft. ?Left lower extremity: Patient has a 7 cm diameter laceration to the left medial lower leg distal to the knee.  This is approximately 4 mm deep.  There is no active bleeding or visible foreign bodies.  No visible tendon/muscle belly injury.  Patient able to flex/extend the knee against resistance.  Intact range of motion throughout the lower extremities.  Tender to palpation only over the wound.  Otherwise nontender.  Compartments are soft.  ?Skin: ?   General: Skin is warm and dry.  ?Neurological:  ?   Mental Status: She is alert.  ?   Comments: Clear speech.  Sensation grossly tact bilateral upper and lower extremities.  5 out of 5 symmetric grip strength and 5 out of 5 strength with plantar and dorsiflexion bilaterally.  Patient able to perform okay sign, thumbs up, cross second/third digits bilaterally.  ?Psychiatric:     ?   Mood and Affect: Affect is flat.     ?   Behavior: Behavior is cooperative.     ?   Thought Content: Thought content includes suicidal ideation.  ? ? ? ? ? ? ? ? ?ED Results / Procedures / Treatments   ?Labs ?(all labs ordered are listed, but only abnormal results are displayed) ?Labs Reviewed  ?COMPREHENSIVE METABOLIC PANEL - Abnormal; Notable for the following components:  ?    Result Value  ? Potassium 3.4 (*)   ? All other components within normal limits  ?RAPID URINE DRUG SCREEN, HOSP PERFORMED - Abnormal; Notable for the following components:  ? Benzodiazepines POSITIVE (*)   ? All other components within normal limits  ?ETHANOL  ?CBC  ?I-STAT BETA HCG BLOOD,  ED ([facility], [facility], [facility] ONLY)  ? ? ?EKG ?None ? ?Radiology ?DG Forearm Left ? ?Result Date: 11/08/2021 ?CLINICAL DATA:  cut EXAM: LEFT FOREARM - 2 VIEW COMPARISON:  None. FINDINGS: The cortical margins of the radius and ulna are intact. There is no evidence of fracture or other focal bone lesions. Elbow and wrist alignment are maintained. Soft tissue edema overlies the antecubital fossa. No tracking soft tissue gas or radiopaque foreign body. IMPRESSION: Soft tissue edema without radiopaque foreign body or tracking soft tissue gas. No osseous abnormality. Electronically Signed   By: Keith Rake M.D.   On: 11/08/2021 22:44  ? ?  DG Tibia/Fibula Left ? ?Result Date: 11/08/2021 ?CLINICAL DATA:  cut EXAM: LEFT TIBIA AND FIBULA - 2 VIEW COMPARISON:  None. FINDINGS: Cortical margins of the tibia and fibula are intact. There is no evidence of fracture or other focal bone lesions. Soft tissue edema anteriorly about the upper lower leg. No tracking soft tissue gas or radiopaque foreign body IMPRESSION: Soft tissue edema proximal anteriorly.  No osseous abnormality. Electronically Signed   By: Keith Rake M.D.   On: 11/08/2021 22:43   ? ?Procedures ?Marland Kitchen.Laceration Repair ? ?Date/Time: 11/09/2021 3:32 AM ?Performed by: Amaryllis Dyke, PA-C ?Authorized by: Amaryllis Dyke, PA-C  ? ?Consent:  ?  Consent obtained:  Verbal ?  Consent given by:  Patient ?  Risks, benefits, and alternatives were discussed: yes   ?  Risks discussed:  Infection, need for additional repair, nerve damage, poor wound healing, poor cosmetic result, pain, retained foreign body, tendon damage and vascular damage ?  Alternatives discussed:  No treatment ?Anesthesia:  ?  Anesthesia method:  Local infiltration ?  Local anesthetic:  Lidocaine 1% w/o epi ?Laceration details:  ?  Location:  Shoulder/arm ?  Arm location: left antecubital fossa. ?  Length (cm):  13 ?  Depth (mm):  7 ?Pre-procedure details:  ?  Preparation:  Patient was prepped and draped  in usual sterile fashion and imaging obtained to evaluate for foreign bodies ?Exploration:  ?  Hemostasis achieved with:  Direct pressure ?  Imaging obtained: x-ray   ?  Imaging outcome: foreign body not note

## 2021-11-08 NOTE — ED Notes (Signed)
IVC paperwork completed. Faxed to [facility] ?
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
## 2021-11-09 ENCOUNTER — Emergency Department (HOSPITAL_COMMUNITY): Payer: BC Managed Care – PPO

## 2021-11-09 DIAGNOSIS — S41112A Laceration without foreign body of left upper arm, initial encounter: Secondary | ICD-10-CM | POA: Diagnosis not present

## 2021-11-09 LAB — RESP PANEL BY RT-PCR (FLU A&B, COVID) ARPGX2
Influenza A by PCR: NEGATIVE
Influenza B by PCR: NEGATIVE
SARS Coronavirus 2 by RT PCR: NEGATIVE

## 2021-11-09 LAB — ACETAMINOPHEN LEVEL: Acetaminophen (Tylenol), Serum: <10 ug/mL — ABNORMAL LOW (ref 10–30)

## 2021-11-09 LAB — SALICYLATE LEVEL: Salicylate Lvl: <7 mg/dL — ABNORMAL LOW (ref 7.0–30.0)

## 2021-11-09 MED ORDER — DOXAZOSIN MESYLATE 4 MG PO TABS
4.0000 mg | ORAL_TABLET | Freq: Every day | ORAL | Status: DC
  Filled 2021-11-09: qty 1

## 2021-11-09 MED ORDER — LAMOTRIGINE 100 MG PO TABS
100.0000 mg | ORAL_TABLET | Freq: Every day | ORAL | Status: DC
  Filled 2021-11-09 (×2): qty 1

## 2021-11-09 MED ORDER — BUPROPION HCL ER (XL) 150 MG PO TB24
150.0000 mg | ORAL_TABLET | Freq: Every day | ORAL | Status: DC
  Filled 2021-11-09 (×2): qty 1

## 2021-11-09 MED ORDER — IBUPROFEN 400 MG PO TABS
600.0000 mg | ORAL_TABLET | Freq: Three times a day (TID) | ORAL | Status: DC | PRN
  Administered 2021-11-10: 600 mg via ORAL
  Filled 2021-11-09: qty 1

## 2021-11-09 MED ORDER — LORAZEPAM 1 MG PO TABS
1.0000 mg | ORAL_TABLET | Freq: Three times a day (TID) | ORAL | Status: DC | PRN
Start: 2021-11-09 — End: 2021-11-10
  Administered 2021-11-09 (×2): 1 mg via ORAL
  Filled 2021-11-09 (×2): qty 1

## 2021-11-09 MED ORDER — ZIPRASIDONE MESYLATE 20 MG IM SOLR
20.0000 mg | Freq: Once | INTRAMUSCULAR | Status: AC
  Administered 2021-11-09: 20 mg via INTRAMUSCULAR
  Filled 2021-11-09: qty 20

## 2021-11-09 MED ORDER — VILAZODONE HCL 20 MG PO TABS
40.0000 mg | ORAL_TABLET | Freq: Every day | ORAL | Status: DC
  Filled 2021-11-09: qty 2

## 2021-11-09 MED ORDER — BACITRACIN ZINC 500 UNIT/GM EX OINT
TOPICAL_OINTMENT | Freq: Two times a day (BID) | CUTANEOUS | Status: DC
  Administered 2021-11-09: 1 via TOPICAL
  Filled 2021-11-09: qty 28.4

## 2021-11-09 MED ORDER — BACITRACIN ZINC 500 UNIT/GM EX OINT
1.0000 "application " | TOPICAL_OINTMENT | Freq: Once | CUTANEOUS | Status: DC
  Filled 2021-11-09: qty 28.4

## 2021-11-09 MED ORDER — LIDOCAINE HCL (PF) 1 % IJ SOLN
30.0000 mL | Freq: Once | INTRAMUSCULAR | Status: DC
Start: 2021-11-09 — End: 2021-11-10
  Filled 2021-11-09: qty 30

## 2021-11-09 NOTE — Progress Notes (Addendum)
Pt was accepted to [facility] 11/10/21 after 8am; [employer]. ? ?Pt meets inpatient criteria per Dr. Lucianne Muss ? ?Attending Physician will be Dr. Estill Cotta ? ?Report can be called to: - [phone] or [phone] ? ?Pt can arrive after 8:0am ? ?Care Team notified: Nutritional therapist, RN, Dahlia Byes, NP, Reginia Naas, RN. ? ? ?CSW called [facility] to updated that pt will no longer admit on 11/10/21. CSW spoke with Lowella Dandy within Intake. ? ?Kelton Pillar, LCSWA ?11/09/2021 @ 6:55 PM ? ?

## 2021-11-09 NOTE — ED Notes (Signed)
Pt on phone with mother. 

## 2021-11-09 NOTE — Consult Note (Signed)
?  Patient states that she cut herself in order to die.  She reports that she stays in the facility, is tired of life, does not want to live anymore.  Patient needs inpatient psychiatric admission for stabilization and treatment ?Patient seen, evaluated by me and treatment plan formulated by me. ?

## 2021-11-09 NOTE — ED Notes (Signed)
Patient sedated from IM medication. Evening medications not given at this time.  ?

## 2021-11-09 NOTE — ED Notes (Signed)
Pt sleeping, respirations even, unlabored; sitter at bedside ?

## 2021-11-09 NOTE — ED Notes (Signed)
Pt alert, calm, cooperative; continues to endorse SI; sitter at bedside ?

## 2021-11-09 NOTE — ED Notes (Signed)
Pt's two belonging bags placed in purple zone locker #9 (bottom cabinet) ?
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
## 2021-11-09 NOTE — ED Provider Notes (Signed)
Patient was upset this evening and she punched a wall.  Superficial 1 cm laceration noted dorsal aspect of the right hand.  Nursing staff will apply bacitracin and a Band-Aid.  Patient has full range of motion no evidence of edema or swelling.  I do not feel x-rays is necessary at this timehand right ? ?Nurse has noticed more swelling.  Will xray.  Pt tearful about going to hold vinyard.   Attempted to harm herself, struck her head against the wall, tried to wrap a cord around her neck.  Staff intervened.  Geodon ordered. ? ?  ?Linwood Dibbles, MD ?11/09/21 1827 ? ?

## 2021-11-09 NOTE — ED Notes (Signed)
Lunch tray at bedside. ?

## 2021-11-09 NOTE — Progress Notes (Signed)
[facility] LCSW Progress Note ?  ?11/09/2021    4:00 PM ? ?Brandi Stanley  ? ?[id number]  ? ?Type of Contact and Topic:  Psychiatric Bed Placement  ? ?[name]     ? ?Patient meets inpatient criteria per Dr. Lucianne Muss ? ?The attending provider will be Dr. Forrestine Him ? ?Call report to [phone] ? ?[name], RN @ [facility] ED notified.    ? ?Pt scheduled  to arrive at [facility] TODAY. Please fax IVC paperwork to [phone].  ? ? ?Damita Dunnings, MSW, LCSW-A  ?4:02 PM 11/09/2021   ?  ? ?  ?  ? ? ? ? ?  ?

## 2021-11-09 NOTE — Progress Notes (Signed)
Patient has been faxed out per the request of Dr. Lucianne Muss. Patient meets [facility] inpatient criteria per Dr. Lucianne Muss. Patient has been faxed out to the following facilities:  ? ?[facility]  [address] [place] [address] [phone]  ?[facility]  [address] [place]., [place] [address] [phone]  ?[facility]  [address], [place] [address] [phone]  ?[facility]  [address], [place] [address] [phone]  ?[facility]  [address], [place] [address] [phone]  ?[facility]  [address] [place] [address] [phone]  ?[facility]  [address] [place], [place] [address] [phone]  ?[facility]  [address] [place], [place] [address] [phone]  ?[facility]  [address] [place] [address] [phone]  ?[facility]  [address] [place] [address] [phone]  ?[facility]  [address], [place] [address] [phone]  ?[facility]  [address], [place] [address] [phone]  ?[facility]  [address] [place] [address] [phone]  ? ?Damita Dunnings, MSW, LCSW-A  ?3:08 PM 11/09/2021   ?

## 2021-11-09 NOTE — ED Notes (Signed)
Psychiatry at bedside.

## 2021-11-09 NOTE — ED Notes (Signed)
Pt resting with eyes closed, breathing regular, unlabored; sitter at bedside ?

## 2021-11-09 NOTE — ED Notes (Signed)
Patient with mild swelling to the left forehead. No LOC, no confusion. Denies any nausea or dizziness.  ?

## 2021-11-09 NOTE — ED Notes (Signed)
Pt wounds redressed w/ bacitracin applied. ?

## 2021-11-09 NOTE — ED Notes (Signed)
Patient quietly speaking with NT ?
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
## 2021-11-09 NOTE — ED Notes (Signed)
Bacitracin and non-adherent dressing applied and wound wrapped. ?

## 2021-11-09 NOTE — ED Notes (Signed)
Pt resting with eyes closed respirations even and unlabored. °

## 2021-11-09 NOTE — ED Notes (Signed)
Stepped off the unit, returned to find security in the room .Was told patient had taken the bandage off of her arm and tied it around her neck. Security had to cut it off with scissors. Patient then preceded to bang her head against the wall 5-8 time. Dr. Tomi Bamberger made aware, patient medicated as ordered.  ?

## 2021-11-10 DIAGNOSIS — S41112A Laceration without foreign body of left upper arm, initial encounter: Secondary | ICD-10-CM | POA: Diagnosis not present

## 2021-11-10 NOTE — ED Notes (Signed)
Called and left message for [facility] to transport patient to [facility] ?

## 2021-11-10 NOTE — ED Notes (Signed)
Pt has sutures to Lt AC . Skin is clean Dry and intact. No dsy to site  . ?

## 2021-11-10 NOTE — ED Provider Notes (Signed)
Emergency Medicine Observation Re-evaluation Note ? ?Brandi Stanley is a 26 y.o. female, seen on rounds today.  Pt initially presented to the ED for complaints of Suicide Attempt ?Currently, the patient is resting. ? ?Physical Exam  ?BP 112/62 (BP Location: Right Arm)   Pulse 78   Temp 98.6 ?F (37 ?C) (Oral)   Resp 16   SpO2 100%  ?Physical Exam ?General: nad, calm, resting ?Cardiac: vss ?Lungs: normal wob, equal chest rise ?Psych: calm, cooperative ? ?ED Course / MDM  ?EKG:  ? ?I have reviewed the labs performed to date as well as medications administered while in observation.  Recent changes in the last 24 hours include pt w/ outburst yesterday pm and punched a wall. XR was neg for fx ? ?Plan  ?Current plan is for placement, [facility] per nursing. ?TAMIKA SHROPSHIRE is under involuntary commitment. ?  ? ?  ?Sloan Leiter, DO ?11/10/21 [phone] ? ?

## 2021-11-10 NOTE — ED Notes (Signed)
[facility] called multiple times at [phone] and [phone], No answer at either number ?

## 2021-11-10 NOTE — ED Notes (Addendum)
Info given to officer [place] to set up transport  ?

## 2021-11-10 NOTE — ED Notes (Signed)
Pt resting at this time, appears to be in no acute distress, will obtain vitals when pt is awake ?

## 2021-12-30 ENCOUNTER — Encounter: Payer: Self-pay | Admitting: Physician Assistant

## 2021-12-30 ENCOUNTER — Ambulatory Visit (INDEPENDENT_AMBULATORY_CARE_PROVIDER_SITE_OTHER): Payer: BC Managed Care – PPO

## 2021-12-30 ENCOUNTER — Ambulatory Visit (INDEPENDENT_AMBULATORY_CARE_PROVIDER_SITE_OTHER): Payer: BC Managed Care – PPO | Admitting: Physician Assistant

## 2021-12-30 DIAGNOSIS — M79671 Pain in right foot: Secondary | ICD-10-CM | POA: Diagnosis not present

## 2021-12-30 DIAGNOSIS — S93401A Sprain of unspecified ligament of right ankle, initial encounter: Secondary | ICD-10-CM | POA: Diagnosis not present

## 2021-12-30 DIAGNOSIS — S93411A Sprain of calcaneofibular ligament of right ankle, initial encounter: Secondary | ICD-10-CM | POA: Diagnosis not present
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
## 2022-01-13 ENCOUNTER — Ambulatory Visit: Payer: BC Managed Care – PPO | Attending: Physician Assistant | Admitting: Physical Therapy

## 2022-01-13 DIAGNOSIS — M357 Hypermobility syndrome: Secondary | ICD-10-CM | POA: Diagnosis present

## 2022-01-13 DIAGNOSIS — M25571 Pain in right ankle and joints of right foot: Secondary | ICD-10-CM | POA: Insufficient documentation

## 2022-01-13 DIAGNOSIS — R600 Localized edema: Secondary | ICD-10-CM | POA: Insufficient documentation

## 2022-01-13 DIAGNOSIS — S93411A Sprain of calcaneofibular ligament of right ankle, initial encounter: Secondary | ICD-10-CM | POA: Diagnosis not present
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
## 2022-01-14 ENCOUNTER — Encounter: Payer: Self-pay | Admitting: Physical Therapy

## 2022-01-28 ENCOUNTER — Ambulatory Visit: Payer: BC Managed Care – PPO | Attending: Physician Assistant | Admitting: Physical Therapy

## 2022-01-28 ENCOUNTER — Encounter: Payer: Self-pay | Admitting: Physical Therapy

## 2022-01-28 DIAGNOSIS — M25571 Pain in right ankle and joints of right foot: Secondary | ICD-10-CM | POA: Insufficient documentation

## 2022-01-28 DIAGNOSIS — R600 Localized edema: Secondary | ICD-10-CM | POA: Insufficient documentation

## 2022-01-28 DIAGNOSIS — M357 Hypermobility syndrome: Secondary | ICD-10-CM | POA: Diagnosis present
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
## 2022-02-03 ENCOUNTER — Ambulatory Visit: Payer: Medicare Other | Admitting: Physician Assistant

## 2022-02-18 ENCOUNTER — Ambulatory Visit: Payer: BC Managed Care – PPO | Admitting: Physical Therapy

## 2022-02-25 ENCOUNTER — Ambulatory Visit: Payer: BC Managed Care – PPO | Attending: Physician Assistant | Admitting: Physical Therapy

## 2022-02-25 ENCOUNTER — Encounter: Payer: Self-pay | Admitting: Physical Therapy

## 2022-02-25 DIAGNOSIS — M357 Hypermobility syndrome: Secondary | ICD-10-CM | POA: Diagnosis present

## 2022-02-25 DIAGNOSIS — R600 Localized edema: Secondary | ICD-10-CM | POA: Diagnosis present

## 2022-02-25 DIAGNOSIS — M25571 Pain in right ankle and joints of right foot: Secondary | ICD-10-CM | POA: Diagnosis not present
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
## 2022-03-03 ENCOUNTER — Ambulatory Visit: Payer: BC Managed Care – PPO | Admitting: Physical Therapy

## 2022-03-03 DIAGNOSIS — M25571 Pain in right ankle and joints of right foot: Secondary | ICD-10-CM

## 2022-03-03 DIAGNOSIS — M357 Hypermobility syndrome: Secondary | ICD-10-CM

## 2022-03-03 DIAGNOSIS — R600 Localized edema: Secondary | ICD-10-CM

## 2022-03-10 ENCOUNTER — Ambulatory Visit: Payer: BC Managed Care – PPO | Admitting: Physical Therapy

## 2022-03-10 ENCOUNTER — Ambulatory Visit (INDEPENDENT_AMBULATORY_CARE_PROVIDER_SITE_OTHER): Payer: BC Managed Care – PPO | Admitting: Physician Assistant

## 2022-03-10 ENCOUNTER — Encounter: Payer: Self-pay | Admitting: Physical Therapy

## 2022-03-10 ENCOUNTER — Encounter: Payer: Self-pay | Admitting: Physician Assistant

## 2022-03-10 DIAGNOSIS — M545 Low back pain, unspecified: Secondary | ICD-10-CM | POA: Diagnosis not present

## 2022-03-10 DIAGNOSIS — G8929 Other chronic pain: Secondary | ICD-10-CM | POA: Diagnosis not present

## 2022-03-10 DIAGNOSIS — M25571 Pain in right ankle and joints of right foot: Secondary | ICD-10-CM | POA: Diagnosis not present

## 2022-03-10 DIAGNOSIS — M357 Hypermobility syndrome: Secondary | ICD-10-CM

## 2022-03-10 DIAGNOSIS — R600 Localized edema: Secondary | ICD-10-CM

## 2022-03-10 DIAGNOSIS — M5440 Lumbago with sciatica, unspecified side: Secondary | ICD-10-CM | POA: Diagnosis not present

## 2022-03-10 MED ORDER — MELOXICAM 15 MG PO TABS: 15.0000 mg | ORAL_TABLET | Freq: Every day | ORAL | 1 refills | Status: DC
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
## 2022-03-31 ENCOUNTER — Encounter: Payer: Self-pay | Admitting: Physical Therapy

## 2022-03-31 ENCOUNTER — Ambulatory Visit: Payer: BC Managed Care – PPO | Attending: Physician Assistant | Admitting: Physical Therapy

## 2022-03-31 DIAGNOSIS — M357 Hypermobility syndrome: Secondary | ICD-10-CM | POA: Insufficient documentation

## 2022-03-31 DIAGNOSIS — M25571 Pain in right ankle and joints of right foot: Secondary | ICD-10-CM | POA: Diagnosis present

## 2022-03-31 DIAGNOSIS — M5459 Other low back pain: Secondary | ICD-10-CM | POA: Insufficient documentation

## 2022-03-31 DIAGNOSIS — R600 Localized edema: Secondary | ICD-10-CM | POA: Insufficient documentation

## 2022-04-20 ENCOUNTER — Other Ambulatory Visit: Payer: Self-pay | Admitting: Physician Assistant

## 2022-04-20 DIAGNOSIS — G8929 Other chronic pain: Secondary | ICD-10-CM

## 2022-04-28 ENCOUNTER — Emergency Department (HOSPITAL_COMMUNITY)
Admission: EM | Admit: 2022-04-28 | Discharge: 2022-04-29 | Disposition: A | Discharge status: Home / Self Care | Payer: BC Managed Care – PPO | Attending: Emergency Medicine | Admitting: Emergency Medicine

## 2022-04-28 ENCOUNTER — Other Ambulatory Visit: Payer: Self-pay

## 2022-04-28 DIAGNOSIS — R102 Pelvic and perineal pain: Secondary | ICD-10-CM | POA: Diagnosis present

## 2022-04-28 DIAGNOSIS — N83209 Unspecified ovarian cyst, unspecified side: Secondary | ICD-10-CM | POA: Insufficient documentation

## 2022-04-28 LAB — CBC WITH DIFFERENTIAL/PLATELET
Abs Immature Granulocytes: 0.03 10*3/uL (ref 0.00–0.07)
Basophils Absolute: 0 10*3/uL (ref 0.0–0.1)
Basophils Relative: 0 %
Eosinophils Absolute: 0 10*3/uL (ref 0.0–0.5)
Eosinophils Relative: 0 %
HCT: 39.6 % (ref 36.0–46.0)
Hemoglobin: 12.9 g/dL (ref 12.0–15.0)
Immature Granulocytes: 0 %
Lymphocytes Relative: 9 %
Lymphs Abs: 0.9 10*3/uL (ref 0.7–4.0)
MCH: 28.7 pg (ref 26.0–34.0)
MCHC: 32.6 g/dL (ref 30.0–36.0)
MCV: 88 fL (ref 80.0–100.0)
Monocytes Absolute: 0.6 10*3/uL (ref 0.1–1.0)
Monocytes Relative: 7 %
Neutro Abs: 7.5 10*3/uL (ref 1.7–7.7)
Neutrophils Relative %: 84 %
Platelets: 167 10*3/uL (ref 150–400)
RBC: 4.5 MIL/uL (ref 3.87–5.11)
RDW: 14.5 % (ref 11.5–15.5)
WBC: 9.1 10*3/uL (ref 4.0–10.5)
nRBC: 0 % (ref 0.0–0.2)

## 2022-04-28 LAB — COMPREHENSIVE METABOLIC PANEL
ALT: 17 U/L (ref 0–44)
AST: 16 U/L (ref 15–41)
Albumin: 4.1 g/dL (ref 3.5–5.0)
Alkaline Phosphatase: 55 U/L (ref 38–126)
Anion gap: 9 (ref 5–15)
BUN: 20 mg/dL (ref 6–20)
CO2: 19 mmol/L — ABNORMAL LOW (ref 22–32)
Calcium: 9.4 mg/dL (ref 8.9–10.3)
Chloride: 110 mmol/L (ref 98–111)
Creatinine, Ser: 0.99 mg/dL (ref 0.44–1.00)
GFR, Estimated: >60 mL/min (ref >60)
Glucose, Bld: 92 mg/dL (ref 70–99)
Potassium: 3.7 mmol/L (ref 3.5–5.1)
Sodium: 138 mmol/L (ref 135–145)
Total Bilirubin: 0.8 mg/dL (ref 0.3–1.2)
Total Protein: 6.7 g/dL (ref 6.5–8.1)

## 2022-04-28 LAB — URINALYSIS, ROUTINE W REFLEX MICROSCOPIC
Bilirubin Urine: NEGATIVE
Glucose, UA: NEGATIVE mg/dL
Hgb urine dipstick: NEGATIVE
Ketones, ur: 20 mg/dL — AB
Leukocytes,Ua: NEGATIVE
Nitrite: NEGATIVE
Protein, ur: NEGATIVE mg/dL
Specific Gravity, Urine: 1.025 (ref 1.005–1.030)
pH: 7 (ref 5.0–8.0)

## 2022-04-28 LAB — I-STAT BETA HCG BLOOD, ED (MC, WL, AP ONLY): I-stat hCG, quantitative: <5 m[IU]/mL (ref <5)

## 2022-04-28 LAB — LIPASE, BLOOD: Lipase: 36 U/L (ref 11–51)

## 2022-04-28 NOTE — ED Provider Triage Note (Signed)
Emergency Medicine Provider Triage Evaluation Note  Brandi Stanley , a 26 y.o. female  was evaluated in triage.  Pt complains of abdominal pain.  Started 2 hours prior to arrival, mostly suprapubic also to the right side.  Denies vomiting.  Status post appendectomy, IUD inserted 2 weeks ago without complications.  Review of Systems  Per HPI  Physical Exam  BP (!) 161/81 (BP Location: Right Arm)   Pulse 90   Temp 98.4 F (36.9 C) (Oral)   Resp 16   SpO2 98%  Gen:   Awake, no distress   Resp:  Normal effort  MSK:   Moves extremities without difficulty  Other:  Suprapubic tenderness  Medical Decision Making  Medically screening exam initiated at 4:27 PM.  Appropriate orders placed.  Brandi Stanley was informed that the remainder of the evaluation will be completed by another provider, this initial triage assessment does not replace that evaluation, and the importance of remaining in the ED until their evaluation is complete.     Theron Arista, PA-C 04/28/22 1627

## 2022-04-28 NOTE — ED Triage Notes (Signed)
Pt here for sudden onset abd pain x2 hours that is suprapubic and RLQ. Pt has hx of Ehlers-Danlo and has had previous appendectomy. Reports dysuria.

## 2022-04-29 ENCOUNTER — Emergency Department (HOSPITAL_COMMUNITY): Payer: BC Managed Care – PPO

## 2022-04-29 DIAGNOSIS — N83209 Unspecified ovarian cyst, unspecified side: Secondary | ICD-10-CM | POA: Diagnosis not present

## 2022-04-29 MED ORDER — IOHEXOL 300 MG/ML  SOLN
80.0000 mL | Freq: Once | INTRAMUSCULAR | Status: AC | PRN
  Administered 2022-04-29: 80 mL via INTRAVENOUS

## 2022-04-29 MED ORDER — HYDROMORPHONE HCL 1 MG/ML IJ SOLN
1.0000 mg | Freq: Once | INTRAMUSCULAR | Status: AC
  Administered 2022-04-29: 1 mg via INTRAVENOUS
  Filled 2022-04-29: qty 1

## 2022-04-29 MED ORDER — HYDROCODONE-ACETAMINOPHEN 5-325 MG PO TABS
1.0000 | ORAL_TABLET | Freq: Four times a day (QID) | ORAL | 0 refills | Status: DC | PRN
Start: 2022-04-29 — End: 2023-03-10

## 2022-04-29 MED ORDER — LACTATED RINGERS IV BOLUS
1000.0000 mL | Freq: Once | INTRAVENOUS | Status: AC
Start: 2022-04-29 — End: 2022-04-29
  Administered 2022-04-29: 1000 mL via INTRAVENOUS

## 2022-04-29 MED ORDER — HYDROCODONE-ACETAMINOPHEN 5-325 MG PO TABS
1.0000 | ORAL_TABLET | Freq: Once | ORAL | Status: AC
  Administered 2022-04-29: 1 via ORAL
  Filled 2022-04-29: qty 1

## 2022-04-29 NOTE — ED Notes (Signed)
Patient transported to CT 

## 2022-04-29 NOTE — ED Notes (Signed)
Patient transported to Ultrasound 

## 2022-05-01 NOTE — ED Provider Notes (Signed)
[facility] EMERGENCY DEPARTMENT Provider Note   CSN: [id number] Arrival date & time: 04/28/22  1549     History  Chief Complaint  Patient presents with   Abdominal Pain    Brandi Stanley is a 26 y.o. female.  26 year old female here with pelvic pain.  Patient states that is been on and off with intercourse for the last few months so she stopped having intercourse for a while.  They had intercourse again yesterday and the pain returned suprapubic and bilateral lower abdominal/pelvic pain.  States that it did not go away that usually does not seems to be worse than normal.  No vaginal discharge.  No urinary symptoms.  No trauma.  No other associated symptoms.   Abdominal Pain      Home Medications Prior to Admission medications   Medication Sig Start Date End Date Taking? Authorizing Provider  HYDROcodone-acetaminophen (NORCO/VICODIN) 5-325 MG tablet Take 1-2 tablets by mouth every 6 (six) hours as needed for severe pain. 04/29/22  Yes Edwinna Rochette, Barbara Cower, MD  buPROPion (WELLBUTRIN XL) 150 MG 24 hr tablet Take 150 mg by mouth at bedtime. Patient not taking: Reported on 01/13/2022 10/07/21   [provider]  diclofenac (VOLTAREN) 75 MG EC tablet Take 1 tablet (75 mg total) by mouth 2 (two) times daily. Patient taking differently: Take 75 mg by mouth 2 (two) times daily as needed for mild pain. 10/11/18   Dalbert Garnet, DO  diclofenac sodium (VOLTAREN) 1 % GEL Apply 4 g topically 4 (four) times daily as needed. Patient taking differently: Apply 2-4 g topically 4 (four) times daily as needed (hand pain). 03/05/19   Hilts, Casimiro Needle, MD  doxazosin (CARDURA) 4 MG tablet Take 4 mg by mouth at bedtime. 11/03/21   [provider]  EPINEPHrine (EPIPEN 2-PAK) 0.3 mg/0.3 mL IJ SOAJ injection Inject 0.3 mLs (0.3 mg total) into the muscle as needed for anaphylaxis. 03/04/19   Fulp, Cammie, MD  ibuprofen (ADVIL) 200 MG tablet Take 800 mg by mouth every 6 (six) hours as needed  for moderate pain or headache.    [provider]  lamoTRIgine (LAMICTAL) 100 MG tablet Take 100 mg by mouth at bedtime. 11/03/21   [provider]  levonorgestrel (MIRENA, 52 MG,) 20 MCG/24HR IUD Mirena 20 mcg/24 hours (7 yrs) 52 mg intrauterine device  Take by intrauterine route.    [provider]  LORazepam (ATIVAN) 1 MG tablet Take 1 mg by mouth 3 (three) times daily as needed. 10/08/21   [provider]  meloxicam (MOBIC) 15 MG tablet Take 1 tablet (15 mg total) by mouth daily. 03/10/22   Persons, [place], PA  Pseudoeph-Doxylamine-DM-APAP (NYQUIL PO) Take 0.5 Bottles by mouth at bedtime as needed (sleep).    [provider]  VIIBRYD 40 MG TABS Take 40 mg by mouth at bedtime. Patient not taking: Reported on 01/13/2022 10/14/21   [provider]      Allergies    Bee venom, Penicillins, and Percocet [oxycodone-acetaminophen]    Review of Systems   Review of Systems  Gastrointestinal:  Positive for abdominal pain.    Physical Exam Updated Vital Signs BP (!) 97/53 (BP Location: Left Arm)   Pulse 71   Temp 98.4 F (36.9 C) (Oral)   Resp 14   SpO2 100%  Physical Exam Vitals and nursing note reviewed.  Constitutional:      Appearance: She is well-developed.  HENT:     Head: Normocephalic and atraumatic.  Cardiovascular:  Rate and Rhythm: Normal rate and regular rhythm.  Pulmonary:     Effort: No respiratory distress.     Breath sounds: No stridor.  Abdominal:     General: Abdomen is flat. There is no distension.     Palpations: Abdomen is soft.     Tenderness: There is abdominal tenderness in the right lower quadrant, suprapubic area and left lower quadrant. There is no rebound.     Hernia: No hernia is present.  Musculoskeletal:     Cervical back: Normal range of motion.  Neurological:     Mental Status: She is alert.     ED Results / Procedures / Treatments   Labs (all labs ordered are listed, but only abnormal  results are displayed) Labs Reviewed  COMPREHENSIVE METABOLIC PANEL - Abnormal; Notable for the following components:      Result Value   CO2 19 (*)    All other components within normal limits  URINALYSIS, ROUTINE W REFLEX MICROSCOPIC - Abnormal; Notable for the following components:   APPearance CLOUDY (*)    Ketones, ur 20 (*)    All other components within normal limits  CBC WITH DIFFERENTIAL/PLATELET  LIPASE, BLOOD  I-STAT BETA HCG BLOOD, ED ([facility], [facility], [facility] ONLY)    EKG None  Radiology CT ABDOMEN PELVIS W CONTRAST  Result Date: 04/29/2022 CLINICAL DATA:  Acute, nonlocalized abdominal pain EXAM: CT ABDOMEN AND PELVIS WITH CONTRAST TECHNIQUE: Multidetector CT imaging of the abdomen and pelvis was performed using the standard protocol following bolus administration of intravenous contrast. RADIATION DOSE REDUCTION: This exam was performed according to the departmental dose-optimization program which includes automated exposure control, adjustment of the mA and/or kV according to patient size and/or use of iterative reconstruction technique. CONTRAST:  55mL OMNIPAQUE IOHEXOL 300 MG/ML  SOLN COMPARISON:  11/24/2016 FINDINGS: Lower chest:  No contributory findings. Hepatobiliary: No focal liver abnormality.No evidence of biliary obstruction or stone. Pancreas: Unremarkable. Spleen: Unremarkable. Adrenals/Urinary Tract: Negative adrenals. No hydronephrosis or stone. Unremarkable bladder. Stomach/Bowel: No obstruction. Appendectomy. No bowel wall thickening. Vascular/Lymphatic: No acute vascular abnormality. No mass or adenopathy. Reproductive:Located IUD. Left ovarian cyst measuring 4.6 cm, hemorrhagic by pelvic ultrasound earlier today. Other: Small volume peritoneal fluid in the lower pelvic recesses, gutters, and around the spleen. From Musculoskeletal: No acute abnormalities. IMPRESSION: Known hemorrhagic cyst in the left ovary, reference preceding ultrasound. Scattered small volume peritoneal  fluid which is likely from cyst leakage. Electronically Signed   By: Tiburcio Pea M.D.   On: 04/29/2022 06:11   US PELVIC COMPLETE W TRANSVAGINAL AND TORSION R/O  Result Date: 04/29/2022 CLINICAL DATA:  Pelvic pain EXAM: TRANSABDOMINAL AND TRANSVAGINAL ULTRASOUND OF PELVIS DOPPLER ULTRASOUND OF OVARIES TECHNIQUE: Both transabdominal and transvaginal ultrasound examinations of the pelvis were performed. Transabdominal technique was performed for global imaging of the pelvis including uterus, ovaries, adnexal regions, and pelvic cul-de-sac. It was necessary to proceed with endovaginal exam following the transabdominal exam to better visualize the endometrium and left ovary. Color and duplex Doppler ultrasound was utilized to evaluate blood flow to the ovaries. COMPARISON:  None Available. FINDINGS: Uterus Measurements: 8.4 x 4.3 x 5.5 cm = volume: 104 mL. No fibroids or other mass visualized. Endometrium Thickness: 7 mm. IUD in satisfactory position in the uterine fundus. Right ovary Measurements: 2.8 x 1.5 x 2.1 cm = volume: 4.3 mL. Normal appearance/no adnexal mass. Left ovary Measurements: 4.8 x 4.6 x 6.0 cm = volume: 69.6 mL. 3.5 x 3.3 x 4.6 cm hemorrhagic cyst. Pulsed Doppler  evaluation of both ovaries demonstrates normal low-resistance arterial and venous waveforms. Other findings Moderately free fluid, simple/physiologic. IMPRESSION: 4.6 cm hemorrhagic left ovarian cyst, likely physiologic. No follow-up is recommended. No evidence of ovarian torsion. IUD in satisfactory position. Electronically Signed   By: Charline Bills M.D.   On: 04/29/2022 03:24    Procedures Procedures    Medications Ordered in ED Medications  HYDROmorphone (DILAUDID) injection 1 mg (1 mg Intravenous Given 04/29/22 0235)  lactated ringers bolus 1,000 mL (0 mLs Intravenous Stopped 04/29/22 0732)  iohexol (OMNIPAQUE) 300 MG/ML solution 80 mL (80 mLs Intravenous Contrast Given 04/29/22 0528)  HYDROcodone-acetaminophen  (NORCO/VICODIN) 5-325 MG per tablet 1 tablet (1 tablet Oral Given 04/29/22 0720)    ED Course/ Medical Decision Making/ A&P                           Medical Decision Making Amount and/or Complexity of Data Reviewed Radiology: ordered.  Risk Prescription drug management.   Ultimately found to have hemorrhagic cyst that is likely leaking causing irritation and subsequent pain. Pain meds provided. Will fu w/ gyn for further management.    Final Clinical Impression(s) / ED Diagnoses Final diagnoses:  Ruptured ovarian cyst    Rx / DC Orders ED Discharge Orders          Ordered    HYDROcodone-acetaminophen (NORCO/VICODIN) 5-325 MG tablet  Every 6 hours PRN        04/29/22 0712              Deklen Popelka, Barbara Cower, MD 05/01/22 0225

## 2022-05-03 ENCOUNTER — Encounter: Payer: Self-pay | Admitting: Plastic Surgery

## 2022-05-03 ENCOUNTER — Ambulatory Visit (INDEPENDENT_AMBULATORY_CARE_PROVIDER_SITE_OTHER): Payer: BC Managed Care – PPO | Admitting: Plastic Surgery

## 2022-05-03 VITALS — BP 122/78 | HR 84 | Ht 69.0 in | Wt 185.0 lb

## 2022-05-03 DIAGNOSIS — M542 Cervicalgia: Secondary | ICD-10-CM | POA: Diagnosis not present

## 2022-05-03 DIAGNOSIS — M545 Low back pain, unspecified: Secondary | ICD-10-CM

## 2022-05-03 DIAGNOSIS — N62 Hypertrophy of breast: Secondary | ICD-10-CM

## 2022-05-03 DIAGNOSIS — M546 Pain in thoracic spine: Secondary | ICD-10-CM | POA: Diagnosis not present

## 2022-05-03 DIAGNOSIS — Z6837 Body mass index (BMI) 37.0-37.9, adult: Secondary | ICD-10-CM

## 2022-05-03 DIAGNOSIS — M5440 Lumbago with sciatica, unspecified side: Secondary | ICD-10-CM

## 2022-05-03 DIAGNOSIS — E43 Unspecified severe protein-calorie malnutrition: Secondary | ICD-10-CM

## 2022-05-03 DIAGNOSIS — F332 Major depressive disorder, recurrent severe without psychotic features: Secondary | ICD-10-CM
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
## 2022-05-13 ENCOUNTER — Emergency Department (HOSPITAL_COMMUNITY)
Admission: EM | Admit: 2022-05-13 | Discharge: 2022-05-14 | Disposition: A | Discharge status: Other Acute Inpatient Hospital | Payer: BC Managed Care – PPO | Attending: Emergency Medicine | Admitting: Emergency Medicine

## 2022-05-13 ENCOUNTER — Encounter (HOSPITAL_COMMUNITY): Payer: Self-pay | Admitting: *Deleted

## 2022-05-13 ENCOUNTER — Other Ambulatory Visit: Payer: Self-pay

## 2022-05-13 DIAGNOSIS — Z20822 Contact with and (suspected) exposure to covid-19: Secondary | ICD-10-CM | POA: Diagnosis not present

## 2022-05-13 DIAGNOSIS — Q796 Ehlers-Danlos syndrome, unspecified: Secondary | ICD-10-CM | POA: Diagnosis not present

## 2022-05-13 DIAGNOSIS — Z79899 Other long term (current) drug therapy: Secondary | ICD-10-CM | POA: Diagnosis not present

## 2022-05-13 DIAGNOSIS — Z1339 Encounter for screening examination for other mental health and behavioral disorders: Secondary | ICD-10-CM | POA: Diagnosis not present

## 2022-05-13 DIAGNOSIS — G8929 Other chronic pain: Secondary | ICD-10-CM | POA: Insufficient documentation

## 2022-05-13 DIAGNOSIS — F333 Major depressive disorder, recurrent, severe with psychotic symptoms: Secondary | ICD-10-CM | POA: Diagnosis present

## 2022-05-13 DIAGNOSIS — R45851 Suicidal ideations: Secondary | ICD-10-CM | POA: Insufficient documentation

## 2022-05-13 LAB — BASIC METABOLIC PANEL
Anion gap: 9 (ref 5–15)
BUN: 17 mg/dL (ref 6–20)
CO2: 25 mmol/L (ref 22–32)
Calcium: 9.6 mg/dL (ref 8.9–10.3)
Chloride: 108 mmol/L (ref 98–111)
Creatinine, Ser: 1.05 mg/dL — ABNORMAL HIGH (ref 0.44–1.00)
GFR, Estimated: >60 mL/min (ref >60)
Glucose, Bld: 94 mg/dL (ref 70–99)
Potassium: 3.5 mmol/L (ref 3.5–5.1)
Sodium: 142 mmol/L (ref 135–145)

## 2022-05-13 LAB — CBC WITH DIFFERENTIAL/PLATELET
Abs Immature Granulocytes: 0.02 10*3/uL (ref 0.00–0.07)
Basophils Absolute: 0 10*3/uL (ref 0.0–0.1)
Basophils Relative: 1 %
Eosinophils Absolute: 0 10*3/uL (ref 0.0–0.5)
Eosinophils Relative: 1 %
HCT: 39.6 % (ref 36.0–46.0)
Hemoglobin: 12.5 g/dL (ref 12.0–15.0)
Immature Granulocytes: 0 %
Lymphocytes Relative: 18 %
Lymphs Abs: 1.1 10*3/uL (ref 0.7–4.0)
MCH: 29.1 pg (ref 26.0–34.0)
MCHC: 31.6 g/dL (ref 30.0–36.0)
MCV: 92.3 fL (ref 80.0–100.0)
Monocytes Absolute: 0.4 10*3/uL (ref 0.1–1.0)
Monocytes Relative: 6 %
Neutro Abs: 4.7 10*3/uL (ref 1.7–7.7)
Neutrophils Relative %: 74 %
Platelets: 164 10*3/uL (ref 150–400)
RBC: 4.29 MIL/uL (ref 3.87–5.11)
RDW: 14.3 % (ref 11.5–15.5)
WBC: 6.2 10*3/uL (ref 4.0–10.5)
nRBC: 0 % (ref 0.0–0.2)

## 2022-05-13 LAB — RAPID URINE DRUG SCREEN, HOSP PERFORMED
Amphetamines: NOT DETECTED
Barbiturates: NOT DETECTED
Benzodiazepines: POSITIVE — AB
Cocaine: NOT DETECTED
Opiates: NOT DETECTED
Tetrahydrocannabinol: NOT DETECTED

## 2022-05-13 LAB — RESP PANEL BY RT-PCR (FLU A&B, COVID) ARPGX2
Influenza A by PCR: NEGATIVE
Influenza B by PCR: NEGATIVE
SARS Coronavirus 2 by RT PCR: NEGATIVE

## 2022-05-13 LAB — SALICYLATE LEVEL: Salicylate Lvl: <7 mg/dL — ABNORMAL LOW (ref 7.0–30.0)

## 2022-05-13 LAB — ETHANOL: Alcohol, Ethyl (B): <10 mg/dL (ref <10)

## 2022-05-13 LAB — I-STAT BETA HCG BLOOD, ED (MC, WL, AP ONLY): I-stat hCG, quantitative: <5 m[IU]/mL (ref <5)

## 2022-05-13 LAB — ACETAMINOPHEN LEVEL: Acetaminophen (Tylenol), Serum: <10 ug/mL — ABNORMAL LOW (ref 10–30)

## 2022-05-13 MED ORDER — DOXAZOSIN MESYLATE 4 MG PO TABS
4.0000 mg | ORAL_TABLET | Freq: Every day | ORAL | Status: DC
  Administered 2022-05-13: 4 mg via ORAL
  Filled 2022-05-13 (×2): qty 1

## 2022-05-13 MED ORDER — IBUPROFEN 800 MG PO TABS: 800.0000 mg | ORAL_TABLET | Freq: Four times a day (QID) | ORAL | Status: DC | PRN

## 2022-05-13 MED ORDER — LORAZEPAM 1 MG PO TABS
1.0000 mg | ORAL_TABLET | Freq: Three times a day (TID) | ORAL | Status: DC | PRN
  Administered 2022-05-13 – 2022-05-14 (×3): 1 mg via ORAL
  Filled 2022-05-13 (×3): qty 1

## 2022-05-13 MED ORDER — MELOXICAM 7.5 MG PO TABS
15.0000 mg | ORAL_TABLET | Freq: Every day | ORAL | Status: DC
  Administered 2022-05-13 – 2022-05-14 (×2): 15 mg via ORAL
  Filled 2022-05-13 (×3): qty 2

## 2022-05-13 MED ORDER — LAMOTRIGINE 100 MG PO TABS
100.0000 mg | ORAL_TABLET | Freq: Every day | ORAL | Status: DC
  Administered 2022-05-13: 100 mg via ORAL
  Filled 2022-05-13: qty 1

## 2022-05-13 NOTE — ED Provider Notes (Signed)
[facility] EMERGENCY DEPARTMENT Provider Note   CSN: [id number] Arrival date & time: 05/13/22  1741     History  Chief Complaint  Patient presents with  . Psychiatric Evaluation    Brandi Stanley is a 26 y.o. female.  Is a 26 year old female with a past medical history of Ehlers-Danlos syndrome and major depressive disorder presenting to the emergency department with suicidal ideation.  He states that she has been feeling increasing suicidal thoughts recently with a plan to cut her throat.  She states that she has not done anything to hurt herself at this time.  She states that she has chronic pain but has had no other medical complaints.  She denies any HI or hallucinations.  She states she would like to be evaluated by psychiatry voluntarily.  The history is provided by the patient.       Home Medications Prior to Admission medications   Medication Sig Start Date End Date Taking? Authorizing Provider  diclofenac (VOLTAREN) 75 MG EC tablet Take 1 tablet (75 mg total) by mouth 2 (two) times daily. Patient taking differently: Take 75 mg by mouth 2 (two) times daily as needed for mild pain. 10/11/18   Dalbert Garnet, DO  diclofenac sodium (VOLTAREN) 1 % GEL Apply 4 g topically 4 (four) times daily as needed. Patient taking differently: Apply 2-4 g topically 4 (four) times daily as needed (hand pain). 03/05/19   Hilts, Casimiro Needle, MD  doxazosin (CARDURA) 4 MG tablet Take 4 mg by mouth at bedtime. 11/03/21   [provider]  EPINEPHrine (EPIPEN 2-PAK) 0.3 mg/0.3 mL IJ SOAJ injection Inject 0.3 mLs (0.3 mg total) into the muscle as needed for anaphylaxis. 03/04/19   Fulp, Cammie, MD  HYDROcodone-acetaminophen (NORCO/VICODIN) 5-325 MG tablet Take 1-2 tablets by mouth every 6 (six) hours as needed for severe pain. 04/29/22   Mesner, Barbara Cower, MD  ibuprofen (ADVIL) 200 MG tablet Take 800 mg by mouth every 6 (six) hours as needed for moderate pain or headache.    [provider]  lamoTRIgine (LAMICTAL) 100 MG tablet Take 100 mg by mouth at bedtime. 11/03/21   [provider]  levonorgestrel (MIRENA, 52 MG,) 20 MCG/24HR IUD Mirena 20 mcg/24 hours (7 yrs) 52 mg intrauterine device  Take by intrauterine route.    [provider]  LORazepam (ATIVAN) 1 MG tablet Take 1 mg by mouth 3 (three) times daily as needed. 10/08/21   [provider]  meloxicam (MOBIC) 15 MG tablet Take 1 tablet (15 mg total) by mouth daily. 03/10/22   Persons, [place], PA  Pseudoeph-Doxylamine-DM-APAP (NYQUIL PO) Take 0.5 Bottles by mouth at bedtime as needed (sleep).    [provider]      Allergies    Bee venom, Penicillins, and Percocet [oxycodone-acetaminophen]    Review of Systems   Review of Systems  Physical Exam Updated Vital Signs BP 135/76 (BP Location: Right Arm)   Pulse 93   Temp 98.2 F (36.8 C) (Oral)   Resp 16   Ht 5\' 9"  (1.753 m)   Wt 83.9 kg   SpO2 100%   BMI 27.31 kg/m  Physical Exam Vitals and nursing note reviewed.  Constitutional:      General: She is not in acute distress.    Appearance: Normal appearance.  HENT:     Head: Normocephalic and atraumatic.     Nose: Nose normal.     Mouth/Throat:     Mouth: Mucous membranes are moist.  Pharynx: Oropharynx is clear.  Eyes:     Extraocular Movements: Extraocular movements intact.     Conjunctiva/sclera: Conjunctivae normal.  Cardiovascular:     Rate and Rhythm: Normal rate and regular rhythm.     Pulses: Normal pulses.     Heart sounds: Normal heart sounds.  Pulmonary:     Effort: Pulmonary effort is normal.     Breath sounds: Normal breath sounds.  Abdominal:     General: Abdomen is flat.     Palpations: Abdomen is soft.     Tenderness: There is no abdominal tenderness.  Musculoskeletal:        General: Normal range of motion.     Cervical back: Normal range of motion and neck supple.     Right lower leg: No edema.     Left lower leg: No edema.   Skin:    General: Skin is warm and dry.  Neurological:     General: No focal deficit present.     Mental Status: She is alert and oriented to person, place, and time.  Psychiatric:     Comments: Calm, cooperative Depressed mood, withdrawn     ED Results / Procedures / Treatments   Labs (all labs ordered are listed, but only abnormal results are displayed) Labs Reviewed  ACETAMINOPHEN LEVEL - Abnormal; Notable for the following components:      Result Value   Acetaminophen (Tylenol), Serum <10 (*)    All other components within normal limits  BASIC METABOLIC PANEL - Abnormal; Notable for the following components:   Creatinine, Ser 1.05 (*)    All other components within normal limits  SALICYLATE LEVEL - Abnormal; Notable for the following components:   Salicylate Lvl <5.4 (*)    All other components within normal limits  RAPID URINE DRUG SCREEN, HOSP PERFORMED - Abnormal; Notable for the following components:   Benzodiazepines POSITIVE (*)    All other components within normal limits  RESP PANEL BY RT-PCR (FLU A&B, COVID) ARPGX2  ETHANOL  CBC WITH DIFFERENTIAL/PLATELET  I-STAT BETA HCG BLOOD, ED ([facility], [facility], [facility] ONLY)    EKG None  Radiology No results found.  Procedures Procedures    Medications Ordered in ED Medications - No data to display  ED Course/ Medical Decision Making/ A&P Clinical Course as of 05/13/22 2124  Fri May 13, 2022  2124 Patient will be signed out to oncoming team pending TTS/psych evaluation [VK]    Clinical Course User Index [VK] Ottie Glazier, DO                           Medical Decision Making This patient presents to the ED with chief complaint(s) of suicidal ideation with pertinent past medical history of Ehlers-Danlos, MDD which further complicates the presenting complaint. The complaint involves an extensive differential diagnosis and also carries with it a high risk of complications and morbidity.    The differential diagnosis  includes suicidal ideation, no evidence of HI or hallucinations, no medical complaints, no evidence of any self injures behaviors  Additional history obtained: Additional history obtained from N/A  ED Course and Reassessment: Patient was initially evaluated by provider in triage and had labs performed for medical clearance.  The patient's labs are within normal range.  She has no medical complaints.  She is medically cleared for TTS evaluation.  Patient would like to be voluntary and is not placed on IVC at this time.  She does have  one-to-one observation for safety.  Independent labs interpretation:  The following labs were independently interpreted: Within normal range  Independent visualization of imaging: N/A  Consultation: - Consulted or discussed management/test interpretation w/ external professional: TTS/psych             Final Clinical Impression(s) / ED Diagnoses Final diagnoses:  None    Rx / DC Orders ED Discharge Orders     None         Phoebe Sharps, DO 05/13/22 2124

## 2022-05-13 NOTE — ED Notes (Signed)
Personal belongings inventoried  are in locker 5 in purple

## 2022-05-13 NOTE — ED Notes (Signed)
Copy Advance directive placed in medical records-Monique,RN
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
## 2022-05-13 NOTE — ED Provider Triage Note (Signed)
Emergency Medicine Provider Triage Evaluation Note  Brandi Stanley , Stanley 26 y.o. female  was evaluated in triage.  Pt complains of SI. Notes that she has plans to cut her throat. Has auditory/visual hallucinations (non-harmful) at this time.  Has been evaluated by her psychiatrist 2 days ago and started on Stanley new medication (amantidine).  Denies HI.  Review of Systems  Positive:  Negative:   Physical Exam  BP 135/76 (BP Location: Right Arm)   Pulse 93   Temp 98.2 F (36.8 C) (Oral)   Resp 16   SpO2 100%  Gen:   Awake, no distress   Resp:  Normal effort  MSK:   Moves extremities without difficulty  Other:    Medical Decision Making  Medically screening exam initiated at 6:09 PM.  Appropriate orders placed.  Brandi Stanley was informed that the remainder of the evaluation will be completed by another provider, this initial triage assessment does not replace that evaluation, and the importance of remaining in the ED until their evaluation is complete.  Patient voluntary at this time, work-up initiated.   Brandi Murfin A, PA-C 05/13/22 1832

## 2022-05-14 DIAGNOSIS — F333 Major depressive disorder, recurrent, severe with psychotic symptoms: Secondary | ICD-10-CM | POA: Diagnosis not present

## 2022-05-14 MED ORDER — AMANTADINE HCL 50 MG/5ML PO SOLN: 60.0000 mg | Freq: Once | ORAL | Status: DC

## 2022-05-14 MED ORDER — AMANTADINE HCL 50 MG/5ML PO SOLN: 100.0000 mg | Freq: Once | ORAL | Status: DC

## 2022-05-14 MED ORDER — AMANTADINE HCL 50 MG/5ML PO SOLN: 50.0000 mg | Freq: Once | ORAL | Status: DC

## 2022-05-14 MED ORDER — AMANTADINE HCL 50 MG/5ML PO SOLN: 70.0000 mg | Freq: Once | ORAL | Status: DC

## 2022-05-14 MED ORDER — AMANTADINE HCL 50 MG/5ML PO SOLN: 90.0000 mg | Freq: Once | ORAL | Status: DC

## 2022-05-14 MED ORDER — AMANTADINE HCL 100 MG PO CAPS: 100.0000 mg | ORAL_CAPSULE | Freq: Every day | ORAL | Status: DC

## 2022-05-14 MED ORDER — AMANTADINE HCL 50 MG/5ML PO SOLN: 40.0000 mg | Freq: Once | ORAL | Status: DC

## 2022-05-14 MED ORDER — ASENAPINE MALEATE 5 MG SL SUBL
10.0000 mg | SUBLINGUAL_TABLET | Freq: Every day | SUBLINGUAL | Status: DC
  Filled 2022-05-14: qty 2

## 2022-05-14 MED ORDER — AMANTADINE HCL 100 MG PO CAPS: 200.0000 mg | ORAL_CAPSULE | Freq: Every day | ORAL | Status: DC

## 2022-05-14 MED ORDER — AMANTADINE HCL 50 MG/5ML PO SOLN: 80.0000 mg | Freq: Once | ORAL | Status: DC

## 2022-05-14 MED ORDER — LAMOTRIGINE 100 MG PO TABS: 200.0000 mg | ORAL_TABLET | Freq: Every day | ORAL | Status: DC

## 2022-05-14 MED ORDER — AMANTADINE HCL 100 MG PO CAPS: 300.0000 mg | ORAL_CAPSULE | Freq: Every day | ORAL | Status: DC

## 2022-05-14 MED ORDER — AMANTADINE HCL 50 MG/5ML PO SOLN
30.0000 mg | Freq: Once | ORAL | Status: AC
  Administered 2022-05-14: 30 mg via ORAL
  Filled 2022-05-14: qty 3

## 2022-05-14 NOTE — Progress Notes (Signed)
Per [facility] admissions, pt has been accepted to [facility]. Accepting provider is Dr. Jonelle Sports. Patient can arrive today. Number for report is [phone] or [phone]. Please fax updated COVID to [phone] before calling report.    Glennie Isle, MSW, LCSW-A Phone: [phone] Disposition/TOC

## 2022-05-14 NOTE — ED Notes (Signed)
Left message with [employer]

## 2022-05-14 NOTE — ED Notes (Signed)
Pt inadvertently moved off the floor in computer then returned to room 49 in computer
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
## 2022-05-14 NOTE — ED Notes (Signed)
Pt made this RN aware that she has an Scientist, physiological stating that she wishes to go to [facility] in [place] for inpatient if at all possible. The Advanced Directive copy was in Records drawer but has not been scanned into chart yet.  Psychiatric NP and SW have been notified.

## 2022-05-14 NOTE — ED Notes (Signed)
TTS in process 

## 2022-05-14 NOTE — ED Notes (Signed)
BH and transport paperwork provided to [facility] in [place] clasp envelope with pt label on it.  [facility] called to advise that the pt is on the way.

## 2022-05-14 NOTE — Progress Notes (Signed)
Per Evette Georges, NP, patient meets criteria for inpatient treatment. There are no available beds at [facility] today. CSW faxed referrals to the following facilities for review:  [facility]  Pending - No Request Sent N/A [address], [place] [address] [phone] --  [place]  Pending - No Request Sent N/A [address], [place] [address] [phone] --  [facility]  Pending - No Request [address] [place] [address] [phone] --  [address] [place] [address] [phone] --  [place]  Pending - No Request Sent N/A [address] [place] [address] [phone] --  [facility]  Pending - No Request Sent N/A [address] [place], [place] [address] [phone] --  [facility]  Pending - No Request Sent N/A [address] [place] [address] [place] --  [facility]  Pending - No Request Sent N/A [address] [place] [address] [facility]  Pending - No Request Sent N/A [address] [place] [address] [phone] --  [facility]  Pending - No Request Sent N/A [address] [place] [address] [phone] --  [place]  Pending - No Request Sent N/A [address], [place] [address] [phone] --  [facility]  Pending - No Request Sent N/A [place], [place] [address] [phone] --  [place]  Pending - No Request Sent N/A [place] [place] [address] [phone] --  [facility]  Pending - No Request Sent N/A [address], [place] [address] [phone] --  [facility]  Pending - No Request Sent N/A [place], [place] [address] [phone] --  [facility]  Pending - No Request Sent N/A [address], [place] [address] [phone] --   TTS will continue to seek bed placement.  Glennie [place], MSW, [facility] Phone: [phone] Disposition/TOC

## 2022-05-14 NOTE — ED Notes (Signed)
Breakfast tray provided to patient.

## 2022-05-14 NOTE — ED Notes (Signed)
Report given to Sheilah Pigeon at [facility].

## 2022-05-14 NOTE — ED Notes (Signed)
Pt requested her tid Ativan.  I administered it. Pt is calm and cooperative.
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
## 2022-06-07 ENCOUNTER — Ambulatory Visit: Payer: BC Managed Care – PPO | Attending: Physician Assistant | Admitting: Physical Therapy

## 2022-06-07 ENCOUNTER — Encounter: Payer: Self-pay | Admitting: Physical Therapy

## 2022-06-07 DIAGNOSIS — M5459 Other low back pain: Secondary | ICD-10-CM | POA: Diagnosis present

## 2022-06-07 DIAGNOSIS — R600 Localized edema: Secondary | ICD-10-CM | POA: Diagnosis present

## 2022-06-07 DIAGNOSIS — G8929 Other chronic pain: Secondary | ICD-10-CM | POA: Diagnosis not present

## 2022-06-07 DIAGNOSIS — M545 Low back pain, unspecified: Secondary | ICD-10-CM | POA: Insufficient documentation

## 2022-06-07 DIAGNOSIS — M25571 Pain in right ankle and joints of right foot: Secondary | ICD-10-CM | POA: Insufficient documentation

## 2022-06-07 DIAGNOSIS — M357 Hypermobility syndrome: Secondary | ICD-10-CM | POA: Insufficient documentation
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
## 2022-06-10 ENCOUNTER — Encounter: Payer: Self-pay | Admitting: Plastic Surgery

## 2022-06-10 ENCOUNTER — Ambulatory Visit (INDEPENDENT_AMBULATORY_CARE_PROVIDER_SITE_OTHER): Payer: BC Managed Care – PPO | Admitting: Plastic Surgery

## 2022-06-10 DIAGNOSIS — N62 Hypertrophy of breast: Secondary | ICD-10-CM

## 2022-06-10 DIAGNOSIS — M549 Dorsalgia, unspecified: Secondary | ICD-10-CM | POA: Diagnosis not present

## 2022-06-10 NOTE — Progress Notes (Signed)
   Subjective:    Patient ID: Brandi Stanley, female    DOB: 07-02-1996, 26 y.o.   MRN: [id number]  The patient is a 26 year old female joining me by phone for further discussion about her breast reduction.  She has thought about whether or not she wants to take the risk because of her Ehlers-Danlos syndrome.  She has decided that she is going to wait for right now and not pursue surgical treatment.  She does still have back pain so she may change her mind but for the moment no surgery.     Review of Systems  Constitutional: Negative.   Eyes: Negative.   Respiratory: Negative.    Cardiovascular: Negative.   Musculoskeletal:  Positive for back pain.       Objective:   Physical Exam        Assessment & Plan:     ICD-10-CM   1. Symptomatic mammary hypertrophy  N62        I connected with  Brandi Stanley on 06/10/22 by phone and verified that I am speaking with the correct person using two identifiers. The patient is here in [place] and I was at the office.  We spent 5 minutes in discussion.   I discussed the limitations of evaluation and management by telemedicine. The patient expressed understanding and agreed to proceed.  I think given the patient's comorbidities this is reasonable and I told the patient I am available if she changes her mind we can certainly talk further.

## 2022-06-14 ENCOUNTER — Ambulatory Visit: Payer: BC Managed Care – PPO | Attending: Cardiology | Admitting: Cardiology

## 2022-06-14 ENCOUNTER — Encounter: Payer: Self-pay | Admitting: Cardiology

## 2022-06-14 VITALS — Ht 69.0 in | Wt 190.6 lb

## 2022-06-14 DIAGNOSIS — R000 Tachycardia, unspecified: Secondary | ICD-10-CM

## 2022-06-14 DIAGNOSIS — R002 Palpitations: Secondary | ICD-10-CM

## 2022-06-14 DIAGNOSIS — R42 Dizziness and giddiness: Secondary | ICD-10-CM

## 2022-06-14 NOTE — Patient Instructions (Signed)
Medication Instructions:  Your physician recommends that you continue on your current medications as directed. Please refer to the Current Medication list given to you today.  *If you need a refill on your cardiac medications before your next appointment, please call your pharmacy*  Follow-Up: At [facility], you and your health needs are our priority.  As part of our continuing mission to provide you with exceptional heart care, we have created designated Provider Care Teams.  These Care Teams include your primary Cardiologist (physician) and Advanced Practice Providers (APPs -  Physician Assistants and Nurse Practitioners) who all work together to provide you with the care you need, when you need it.  We recommend signing up for the patient portal called "MyChart".  Sign up information is provided on this After Visit Summary.  MyChart is used to connect with patients for Virtual Visits (Telemedicine).  Patients are able to view lab/test results, encounter notes, upcoming appointments, etc.  Non-urgent messages can be sent to your provider as well.   To learn more about what you can do with MyChart, go to [email or web address].    Your next appointment:   12 month(s)  The format for your next appointment:   In Person  Provider:   Christopher L Schumann, MD         
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
## 2022-06-15 ENCOUNTER — Ambulatory Visit: Payer: BC Managed Care – PPO | Admitting: Physical Therapy

## 2022-06-17 ENCOUNTER — Ambulatory Visit: Payer: BC Managed Care – PPO | Admitting: Physical Therapy

## 2022-06-17 ENCOUNTER — Encounter: Payer: Self-pay | Admitting: Physical Therapy

## 2022-06-17 DIAGNOSIS — M357 Hypermobility syndrome: Secondary | ICD-10-CM

## 2022-06-17 DIAGNOSIS — M5459 Other low back pain: Secondary | ICD-10-CM

## 2022-06-17 DIAGNOSIS — M25571 Pain in right ankle and joints of right foot: Secondary | ICD-10-CM

## 2022-06-17 DIAGNOSIS — R600 Localized edema: Secondary | ICD-10-CM
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
## 2022-06-21 ENCOUNTER — Ambulatory Visit: Payer: BC Managed Care – PPO | Admitting: Physical Therapy

## 2022-06-21 ENCOUNTER — Encounter: Payer: Self-pay | Admitting: Physical Therapy

## 2022-06-21 DIAGNOSIS — M5459 Other low back pain: Secondary | ICD-10-CM

## 2022-06-21 DIAGNOSIS — M357 Hypermobility syndrome: Secondary | ICD-10-CM
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
## 2022-06-23 ENCOUNTER — Encounter: Payer: Self-pay | Admitting: Physical Therapy

## 2022-06-23 ENCOUNTER — Ambulatory Visit: Payer: BC Managed Care – PPO | Attending: Physician Assistant | Admitting: Physical Therapy

## 2022-06-23 DIAGNOSIS — M357 Hypermobility syndrome: Secondary | ICD-10-CM | POA: Diagnosis not present

## 2022-06-23 DIAGNOSIS — M5459 Other low back pain: Secondary | ICD-10-CM | POA: Insufficient documentation

## 2022-06-23 DIAGNOSIS — R600 Localized edema: Secondary | ICD-10-CM | POA: Diagnosis present

## 2022-06-23 DIAGNOSIS — M25571 Pain in right ankle and joints of right foot: Secondary | ICD-10-CM | POA: Insufficient documentation
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
## 2022-06-28 ENCOUNTER — Encounter: Payer: Self-pay | Admitting: Physical Therapy

## 2022-06-28 ENCOUNTER — Ambulatory Visit: Payer: BC Managed Care – PPO | Admitting: Physical Therapy

## 2022-06-28 DIAGNOSIS — M5459 Other low back pain: Secondary | ICD-10-CM

## 2022-06-28 DIAGNOSIS — M25571 Pain in right ankle and joints of right foot: Secondary | ICD-10-CM

## 2022-06-28 DIAGNOSIS — M357 Hypermobility syndrome: Secondary | ICD-10-CM

## 2022-06-28 DIAGNOSIS — R600 Localized edema: Secondary | ICD-10-CM
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
## 2022-06-30 ENCOUNTER — Ambulatory Visit: Payer: BC Managed Care – PPO | Admitting: Physical Therapy
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
## 2022-07-05 ENCOUNTER — Encounter: Payer: Self-pay | Admitting: Physical Therapy

## 2022-07-05 ENCOUNTER — Ambulatory Visit: Payer: BC Managed Care – PPO | Admitting: Physical Therapy

## 2022-07-05 DIAGNOSIS — M25571 Pain in right ankle and joints of right foot: Secondary | ICD-10-CM

## 2022-07-05 DIAGNOSIS — M357 Hypermobility syndrome: Secondary | ICD-10-CM | POA: Diagnosis not present

## 2022-07-05 DIAGNOSIS — R600 Localized edema: Secondary | ICD-10-CM

## 2022-07-05 DIAGNOSIS — M5459 Other low back pain: Secondary | ICD-10-CM

## 2022-07-07 ENCOUNTER — Ambulatory Visit: Payer: BC Managed Care – PPO | Admitting: Physical Therapy
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
## 2022-07-12 ENCOUNTER — Encounter: Payer: Self-pay | Admitting: Physical Therapy

## 2022-07-12 ENCOUNTER — Ambulatory Visit: Payer: BC Managed Care – PPO | Admitting: Physical Therapy

## 2022-07-12 DIAGNOSIS — M357 Hypermobility syndrome: Secondary | ICD-10-CM | POA: Diagnosis not present

## 2022-07-12 DIAGNOSIS — M25571 Pain in right ankle and joints of right foot: Secondary | ICD-10-CM

## 2022-07-12 DIAGNOSIS — M5459 Other low back pain: Secondary | ICD-10-CM

## 2022-07-12 DIAGNOSIS — R600 Localized edema: Secondary | ICD-10-CM

## 2022-07-19 ENCOUNTER — Encounter: Payer: Self-pay | Admitting: Physical Therapy

## 2022-07-19 ENCOUNTER — Ambulatory Visit: Payer: BC Managed Care – PPO | Admitting: Physical Therapy

## 2022-07-19 DIAGNOSIS — M25571 Pain in right ankle and joints of right foot: Secondary | ICD-10-CM

## 2022-07-19 DIAGNOSIS — R600 Localized edema: Secondary | ICD-10-CM

## 2022-07-19 DIAGNOSIS — M357 Hypermobility syndrome: Secondary | ICD-10-CM

## 2022-07-19 DIAGNOSIS — M5459 Other low back pain: Secondary | ICD-10-CM
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
## 2022-07-21 ENCOUNTER — Ambulatory Visit: Payer: BC Managed Care – PPO | Admitting: Physical Therapy

## 2022-07-21 ENCOUNTER — Encounter: Payer: Self-pay | Admitting: Physical Therapy

## 2022-07-21 DIAGNOSIS — M357 Hypermobility syndrome: Secondary | ICD-10-CM

## 2022-07-21 DIAGNOSIS — R600 Localized edema: Secondary | ICD-10-CM

## 2022-07-21 DIAGNOSIS — M5459 Other low back pain: Secondary | ICD-10-CM

## 2022-07-21 DIAGNOSIS — M25571 Pain in right ankle and joints of right foot: Secondary | ICD-10-CM
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
## 2022-07-26 ENCOUNTER — Encounter: Payer: Self-pay | Admitting: Physical Therapy

## 2022-07-26 ENCOUNTER — Ambulatory Visit: Payer: BC Managed Care – PPO | Attending: Physician Assistant | Admitting: Physical Therapy

## 2022-07-26 DIAGNOSIS — M357 Hypermobility syndrome: Secondary | ICD-10-CM

## 2022-07-26 DIAGNOSIS — M5459 Other low back pain: Secondary | ICD-10-CM

## 2022-07-26 DIAGNOSIS — R600 Localized edema: Secondary | ICD-10-CM | POA: Diagnosis present

## 2022-07-26 DIAGNOSIS — M25571 Pain in right ankle and joints of right foot: Secondary | ICD-10-CM | POA: Diagnosis present

## 2022-07-28 ENCOUNTER — Ambulatory Visit: Payer: BC Managed Care – PPO | Admitting: Physical Therapy

## 2022-08-02 ENCOUNTER — Ambulatory Visit: Payer: BC Managed Care – PPO | Admitting: Physical Therapy
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
## 2022-08-04 ENCOUNTER — Telehealth: Payer: Self-pay | Admitting: Physical Therapy

## 2022-08-04 ENCOUNTER — Ambulatory Visit: Payer: BC Managed Care – PPO | Admitting: Physical Therapy
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
## 2022-08-04 NOTE — Telephone Encounter (Signed)
Called patient this AM regarding her missed appt .  Left message on voicemail offering other openings this week for PT.  She can call us back if and when she is ready to reschedule.  She missed an apt Tuesday as she was not feeling well so I assume that is the case today.   Karie Mainland, PT 08/04/22 9:21 AM Phone: [phone] Fax: [phone]

## 2022-08-05 ENCOUNTER — Ambulatory Visit: Payer: BC Managed Care – PPO | Admitting: Physical Therapy

## 2022-08-05 ENCOUNTER — Encounter: Payer: Self-pay | Admitting: Physical Therapy

## 2022-08-05 DIAGNOSIS — M25571 Pain in right ankle and joints of right foot: Secondary | ICD-10-CM

## 2022-08-05 DIAGNOSIS — R600 Localized edema: Secondary | ICD-10-CM

## 2022-08-05 DIAGNOSIS — M357 Hypermobility syndrome: Secondary | ICD-10-CM

## 2022-08-05 DIAGNOSIS — M5459 Other low back pain: Secondary | ICD-10-CM

## 2022-08-11 ENCOUNTER — Ambulatory Visit: Payer: BC Managed Care – PPO | Admitting: Physical Therapy
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
## 2022-08-18 ENCOUNTER — Ambulatory Visit: Payer: BC Managed Care – PPO | Admitting: Physical Therapy

## 2022-08-24 ENCOUNTER — Ambulatory Visit: Payer: BC Managed Care – PPO | Admitting: Physical Therapy
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
## 2022-08-26 ENCOUNTER — Ambulatory Visit: Payer: BC Managed Care – PPO | Attending: Physician Assistant | Admitting: Physical Therapy

## 2022-08-26 DIAGNOSIS — M5459 Other low back pain: Secondary | ICD-10-CM | POA: Diagnosis present

## 2022-08-26 DIAGNOSIS — M25571 Pain in right ankle and joints of right foot: Secondary | ICD-10-CM | POA: Diagnosis present

## 2022-08-26 DIAGNOSIS — M357 Hypermobility syndrome: Secondary | ICD-10-CM | POA: Diagnosis not present

## 2022-08-26 DIAGNOSIS — R600 Localized edema: Secondary | ICD-10-CM

## 2022-08-29 ENCOUNTER — Ambulatory Visit: Payer: BC Managed Care – PPO

## 2022-08-29 DIAGNOSIS — R600 Localized edema: Secondary | ICD-10-CM

## 2022-08-29 DIAGNOSIS — M357 Hypermobility syndrome: Secondary | ICD-10-CM

## 2022-08-29 DIAGNOSIS — M5459 Other low back pain: Secondary | ICD-10-CM
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
## 2022-08-29 NOTE — Addendum Note (Signed)
Addended by: Raeford Razor L on: 08/29/2022 07:40 AM   Modules accepted: Orders

## 2022-08-29 NOTE — Patient Instructions (Signed)

## 2022-09-02 ENCOUNTER — Ambulatory Visit: Payer: BC Managed Care – PPO | Admitting: Physical Therapy

## 2022-09-02 ENCOUNTER — Encounter: Payer: Self-pay | Admitting: Physical Therapy

## 2022-09-02 DIAGNOSIS — M357 Hypermobility syndrome: Secondary | ICD-10-CM

## 2022-09-02 DIAGNOSIS — M25571 Pain in right ankle and joints of right foot: Secondary | ICD-10-CM

## 2022-09-02 DIAGNOSIS — M5459 Other low back pain: Secondary | ICD-10-CM

## 2022-09-02 DIAGNOSIS — R600 Localized edema: Secondary | ICD-10-CM
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
## 2022-09-05 ENCOUNTER — Ambulatory Visit: Payer: BC Managed Care – PPO

## 2022-09-05 DIAGNOSIS — M5459 Other low back pain: Secondary | ICD-10-CM

## 2022-09-05 DIAGNOSIS — M357 Hypermobility syndrome: Secondary | ICD-10-CM

## 2022-09-05 DIAGNOSIS — R600 Localized edema: Secondary | ICD-10-CM
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
## 2022-09-14 ENCOUNTER — Ambulatory Visit: Payer: BC Managed Care – PPO | Admitting: Physical Therapy

## 2022-09-14 DIAGNOSIS — M5459 Other low back pain: Secondary | ICD-10-CM

## 2022-09-14 DIAGNOSIS — M357 Hypermobility syndrome: Secondary | ICD-10-CM

## 2022-09-14 DIAGNOSIS — R600 Localized edema: Secondary | ICD-10-CM
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
## 2022-09-21 ENCOUNTER — Ambulatory Visit: Payer: BC Managed Care – PPO | Admitting: Physical Therapy

## 2023-01-19 ENCOUNTER — Emergency Department (HOSPITAL_COMMUNITY)
Admission: EM | Admit: 2023-01-19 | Discharge: 2023-01-20 | Disposition: A | Discharge status: Home / Self Care | Payer: BC Managed Care – PPO | Attending: Emergency Medicine | Admitting: Emergency Medicine

## 2023-01-19 ENCOUNTER — Encounter (HOSPITAL_COMMUNITY): Payer: Self-pay

## 2023-01-19 DIAGNOSIS — M545 Low back pain, unspecified: Secondary | ICD-10-CM | POA: Diagnosis not present

## 2023-01-19 DIAGNOSIS — K529 Noninfective gastroenteritis and colitis, unspecified: Secondary | ICD-10-CM

## 2023-01-19 DIAGNOSIS — R1032 Left lower quadrant pain: Secondary | ICD-10-CM | POA: Insufficient documentation

## 2023-01-19 DIAGNOSIS — R1030 Lower abdominal pain, unspecified: Secondary | ICD-10-CM

## 2023-01-19 DIAGNOSIS — R1031 Right lower quadrant pain: Secondary | ICD-10-CM | POA: Diagnosis not present

## 2023-01-19 LAB — CBC WITH DIFFERENTIAL/PLATELET
Abs Immature Granulocytes: 0.04 10*3/uL (ref 0.00–0.07)
Basophils Absolute: 0 10*3/uL (ref 0.0–0.1)
Basophils Relative: 1 %
Eosinophils Absolute: 0 10*3/uL (ref 0.0–0.5)
Eosinophils Relative: 1 %
HCT: 38.3 % (ref 36.0–46.0)
Hemoglobin: 12.3 g/dL (ref 12.0–15.0)
Immature Granulocytes: 1 %
Lymphocytes Relative: 9 %
Lymphs Abs: 0.7 10*3/uL (ref 0.7–4.0)
MCH: 29.7 pg (ref 26.0–34.0)
MCHC: 32.1 g/dL (ref 30.0–36.0)
MCV: 92.5 fL (ref 80.0–100.0)
Monocytes Absolute: 0.8 10*3/uL (ref 0.1–1.0)
Monocytes Relative: 10 %
Neutro Abs: 7 10*3/uL (ref 1.7–7.7)
Neutrophils Relative %: 78 %
Platelets: 142 10*3/uL — ABNORMAL LOW (ref 150–400)
RBC: 4.14 MIL/uL (ref 3.87–5.11)
RDW: 13.3 % (ref 11.5–15.5)
WBC: 8.7 10*3/uL (ref 4.0–10.5)
nRBC: 0 % (ref 0.0–0.2)

## 2023-01-19 LAB — I-STAT BETA HCG BLOOD, ED (MC, WL, AP ONLY): I-stat hCG, quantitative: 6 m[IU]/mL — ABNORMAL HIGH (ref <5)

## 2023-01-19 MED ORDER — ONDANSETRON HCL 4 MG/2ML IJ SOLN
4.0000 mg | Freq: Once | INTRAMUSCULAR | Status: AC
  Administered 2023-01-19: 4 mg via INTRAVENOUS
  Filled 2023-01-19: qty 2

## 2023-01-19 MED ORDER — KETOROLAC TROMETHAMINE 15 MG/ML IJ SOLN
15.0000 mg | Freq: Once | INTRAMUSCULAR | Status: AC
  Administered 2023-01-19: 15 mg via INTRAVENOUS
  Filled 2023-01-19: qty 1

## 2023-01-19 MED ORDER — SODIUM CHLORIDE 0.9 % IV BOLUS
1000.0000 mL | Freq: Once | INTRAVENOUS | Status: AC
  Administered 2023-01-19: 1000 mL via INTRAVENOUS

## 2023-01-19 NOTE — ED Triage Notes (Signed)
Pt presents to ED for evaluation of lower quadrant pain that radiates to both flanks. Pt has nausea but denies vomiting or diarrhea.

## 2023-01-19 NOTE — ED Provider Notes (Signed)
[place] EMERGENC[facility] DEPARTMENT AT [facility] Provider Note   CSN: [id number] Arrival date & time: 01/19/23  2221     History  Chief Complaint  Patient presents with   Abdominal Pain    Brandi Stanley is a 27 y.o. female with past medical history Erler's Danlos syndrome, IBS, anxiety who presents to the ED complaining of abrupt onset of severe lower abdominal pain this evening.  She states that it woke her from her sleep.  It is not associated with nausea, vomiting, diarrhea, fever, chills, dysuria, hematuria.  Some associated lower back pain as well.  No history of similar pain.  Previous abdominal surgeries include appendectomy.  Denies chance of pregnancy.  No vaginal bleeding or discharge.  She does have a history of ovarian cyst as well but states that pain does not feel similar to that previous episode.      Home Medications Prior to Admission medications   Medication Sig Start Date End Date Taking? Authorizing Provider  lithium carbonate (LITHOBID) 300 MG ER tablet Take by mouth. 08/08/22  Yes [provider]  zaleplon (SONATA) 10 MG capsule Take 10-20 mg by mouth at bedtime as needed for sleep. 12/29/22  Yes [provider]  zolpidem (AMBIEN) 10 MG tablet Take 5-10 mg by mouth at bedtime as needed for sleep. 11/28/22  Yes [provider]  amantadine (SYMMETREL) 100 MG capsule Take 100 mg by mouth See admin instructions. (After Starter) Take 1 capsule by mouth in the morning for 1 week, then 2 capsules by mouth in the morning for 1 week, then 3 capsules in the morning.    [provider]  amantadine (SYMMETREL) 50 MG/5ML solution Take 1 mL by mouth See admin instructions. Take 1 ML by mouth in the morning. Increase daily by over 9 days, Then switch to oral capsule 05/12/22   [provider]  Asenapine Maleate 10 MG SUBL Take 15 mg by mouth at bedtime. 04/14/22   [provider]  Cholecalciferol (VITAMIN D3) 1.25 MG  (50000 UT) CAPS Take 1 capsule by mouth once a week.    [provider]  diclofenac (VOLTAREN) 75 MG EC tablet Take 1 tablet (75 mg total) by mouth 2 (two) times daily. Patient taking differently: Take 75 mg by mouth 2 (two) times daily as needed for mild pain. 10/11/18   Dalbert Garnet, DO  diclofenac sodium (VOLTAREN) 1 % GEL Apply 4 g topically 4 (four) times daily as needed. Patient taking differently: Apply 2-4 g topically 4 (four) times daily as needed (hand pain). 03/05/19   Hilts, Casimiro Needle, MD  dicyclomine (BENTYL) 10 MG capsule Take 10 mg by mouth daily as needed for spasms.    [provider]  doxazosin (CARDURA) 4 MG tablet Take 4 mg by mouth at bedtime. 11/03/21   [provider]  doxazosin (CARDURA) 4 MG tablet Take 4 mg by mouth at bedtime.    [provider]  EPINEPHrine (EPIPEN 2-PAK) 0.3 mg/0.3 mL IJ SOAJ injection Inject 0.3 mLs (0.3 mg total) into the muscle as needed for anaphylaxis. 03/04/19   Fulp, Cammie, MD  HYDROcodone-acetaminophen (NORCO/VICODIN) 5-325 MG tablet Take 1-2 tablets by mouth every 6 (six) hours as needed for severe pain. Patient not taking: Reported on 06/14/2022 04/29/22   Mesner, Barbara Cower, MD  lamoTRIgine (LAMICTAL) 100 MG tablet Take 200 mg by mouth at bedtime. 11/03/21   [provider]  levonorgestrel (MIRENA, 52 MG,) 20 MCG/24HR IUD 1 each by Intrauterine  route once.    [provider]  LORazepam (ATIVAN) 1 MG tablet Take 1 mg by mouth in the morning, at noon, and at bedtime. 10/08/21   [provider]  meloxicam (MOBIC) 15 MG tablet Take 1 tablet (15 mg total) by mouth daily. 03/10/22   Persons, [place], PA  SPRAVATO, 84 MG DOSE, 28 MG/DEVICE SOPK Place into both nostrils.    [provider]  tranylcypromine (PARNATE) 10 MG tablet Take by mouth.    [provider]      Allergies    Bee venom, Penicillins, and Percocet [oxycodone-acetaminophen]    Review of Systems   Review of  Systems  All other systems reviewed and are negative.   Physical Exam Updated Vital Signs BP 132/85 (BP Location: Right Arm)   Pulse 85   Temp 97.8 F (36.6 C) (Oral)   Resp 20   SpO2 100%  Physical Exam Vitals and nursing note reviewed.  Constitutional:      General: She is not in acute distress.    Appearance: Normal appearance. She is not toxic-appearing or diaphoretic.  HENT:     Head: Normocephalic and atraumatic.     Mouth/Throat:     Comments: Mildly dry mucus membranes Eyes:     Conjunctiva/sclera: Conjunctivae normal.  Cardiovascular:     Rate and Rhythm: Normal rate and regular rhythm.     Heart sounds: No murmur heard. Pulmonary:     Effort: Pulmonary effort is normal.     Breath sounds: Normal breath sounds.  Abdominal:     General: Abdomen is flat.     Palpations: Abdomen is soft. There is no mass or pulsatile mass.     Tenderness: There is abdominal tenderness (moderate generalized lower). There is no right CVA tenderness, left CVA tenderness, guarding or rebound. Negative signs include Murphy's sign.  Musculoskeletal:        General: Normal range of motion.     Cervical back: Neck supple.     Right lower leg: No edema.     Left lower leg: No edema.     Comments: No midline CTL spinal tenderness, stepoffs, or deformities  Skin:    General: Skin is warm and dry.     Capillary Refill: Capillary refill takes less than 2 seconds.  Neurological:     General: No focal deficit present.     Mental Status: She is alert and oriented to person, place, and time. Mental status is at baseline.  Psychiatric:        Mood and Affect: Mood is anxious.        Speech: Speech normal.        Behavior: Behavior is cooperative.     ED Results / Procedures / Treatments   Labs (all labs ordered are listed, but only abnormal results are displayed) Labs Reviewed  CBC WITH DIFFERENTIAL/PLATELET  COMPREHENSIVE METABOLIC PANEL  LIPASE, BLOOD  URINALYSIS, ROUTINE W REFLEX  MICROSCOPIC  I-STAT BETA HCG BLOOD, ED ([facility], [facility], [facility] ONLY)    EKG None  Radiology No results found.  Procedures Procedures    Medications Ordered in ED Medications  ketorolac (TORADOL) 15 MG/ML injection 15 mg (15 mg Intravenous Given 01/19/23 2328)  ondansetron (ZOFRAN) injection 4 mg (4 mg Intravenous Given 01/19/23 2328)  sodium chloride 0.9 % bolus 1,000 mL (1,000 mLs Intravenous New Bag/Given 01/19/23 2328)    ED Course/ Medical Decision Making/ A&P  Medical Decision Making Amount and/or Complexity of Data Reviewed Labs: ordered. Decision-making details documented in ED Course. Radiology: ordered. Decision-making details documented in ED Course.  Risk Prescription drug management.   Medical Decision Making:   Brandi Stanley is a 27 y.o. female who presented to the ED today with abdominal pain detailed above.    Patient's presentation is complicated by their history of EDS.  Complete initial physical exam performed, notably the patient  was in NAD. Moderate generalized lower abdominal tenderness but abdomen soft, non-distended. No rebound, guarding, or peritoneal signs. No CVA tenderness. Mildly dry mucus membranes.    Reviewed and confirmed nursing documentation for past medical history, family history, social history.    Initial Assessment:   With the patient's presentation of abdominal pain, differential diagnosis includes but is not limited to AAA, mesenteric ischemia, appendicitis, diverticulitis, DKA, gastritis, gastroenteritis, AMI, nephrolithiasis, pancreatitis, peritonitis, adrenal insufficiency, intestinal ischemia, constipation, UTI, SBO/LBO, splenic rupture, biliary disease, IBD, IBS, PUD, hepatitis, STD, ovarian/testicular torsion, electrolyte disturbance, DKA, dehydration, acute kidney injury, renal failure, cholecystitis, cholelithiasis, choledocholithiasis, abdominal pain of  unknown etiology, pregnancy, incomplete abortion, septic  abortion, threatened abortion, ectopic pregnancy, PID.   Initial Plan:  Screening labs including CBC and Metabolic panel to evaluate for infectious or metabolic etiology of disease.  Lipase to evaluate for pancreatitis Urinalysis with reflex culture ordered to evaluate for UTI or relevant urologic/nephrologic pathology.  CT abd/pelvis to evaluate for intra-abdominal pathology Beta HCG for pregnancy testing Symptomatic management Objective evaluation as reviewed   Final Assessment and Plan:   27 year old female presenting to ED with abrupt onset bilateral lower quadrant abdominal pain. No other associated symptoms. On exam, diffuse lower abdominal tenderness. No vaginal bleeding or discharge. No concern for STDs. Low suspicion for PID. Pt uncomfortable appearing on initial exam, very anxious. Abdomen non-distended. Workup initiated as above for further assessment. Pt pending labs/imaging at shift change and care transitioned to oncoming PA-C, Roxy Horseman, pending workup and disposition.     Clinical Impression:  1. Lower abdominal pain      Data Unavailable           Final Clinical Impression(s) / ED Diagnoses Final diagnoses:  Lower abdominal pain    Rx / DC Orders ED Discharge Orders     None         Tonette Lederer, PA-C 01/19/23 2339    Vanetta Mulders, MD 01/21/23 2333

## 2023-01-20 ENCOUNTER — Encounter (HOSPITAL_COMMUNITY): Payer: Self-pay

## 2023-01-20 ENCOUNTER — Emergency Department (HOSPITAL_COMMUNITY): Payer: BC Managed Care – PPO

## 2023-01-20 DIAGNOSIS — R1031 Right lower quadrant pain: Secondary | ICD-10-CM | POA: Diagnosis not present

## 2023-01-20 LAB — COMPREHENSIVE METABOLIC PANEL
ALT: 30 U/L (ref 0–44)
AST: 26 U/L (ref 15–41)
Albumin: 4 g/dL (ref 3.5–5.0)
Alkaline Phosphatase: 50 U/L (ref 38–126)
Anion gap: 7 (ref 5–15)
BUN: 14 mg/dL (ref 6–20)
CO2: 21 mmol/L — ABNORMAL LOW (ref 22–32)
Calcium: 8.8 mg/dL — ABNORMAL LOW (ref 8.9–10.3)
Chloride: 108 mmol/L (ref 98–111)
Creatinine, Ser: 0.91 mg/dL (ref 0.44–1.00)
GFR, Estimated: >60 mL/min (ref >60)
Glucose, Bld: 102 mg/dL — ABNORMAL HIGH (ref 70–99)
Potassium: 3.4 mmol/L — ABNORMAL LOW (ref 3.5–5.1)
Sodium: 136 mmol/L (ref 135–145)
Total Bilirubin: 0.5 mg/dL (ref 0.3–1.2)
Total Protein: 6.8 g/dL (ref 6.5–8.1)

## 2023-01-20 LAB — URINALYSIS, ROUTINE W REFLEX MICROSCOPIC
Bilirubin Urine: NEGATIVE
Glucose, UA: NEGATIVE mg/dL
Hgb urine dipstick: NEGATIVE
Ketones, ur: NEGATIVE mg/dL
Leukocytes,Ua: NEGATIVE
Nitrite: NEGATIVE
Protein, ur: NEGATIVE mg/dL
Specific Gravity, Urine: >1.046 — ABNORMAL HIGH (ref 1.005–1.030)
pH: 6 (ref 5.0–8.0)

## 2023-01-20 LAB — LIPASE, BLOOD: Lipase: 35 U/L (ref 11–51)

## 2023-01-20 LAB — PREGNANCY, URINE: Preg Test, Ur: NEGATIVE

## 2023-01-20 MED ORDER — IOHEXOL 300 MG/ML  SOLN
100.0000 mL | Freq: Once | INTRAMUSCULAR | Status: AC | PRN
  Administered 2023-01-20: 100 mL via INTRAVENOUS

## 2023-01-20 NOTE — Discharge Instructions (Signed)
If you develop worsening, pain, fever, or bloody stools, return to the ER.   Your labs and CT scan looked pretty good.  There was a possible area of inflammation of the colon, but this is not thought to be a bacterial infection at this point.  Monitor your symptoms closely for the next day or two and if something changes, please return.

## 2023-01-20 NOTE — ED Provider Notes (Signed)
Patient here with lower abdominal pain that started suddenly this evening.  Symptoms have significantly improved.  She had CT imaging which showed questionable early colitis, but patient does not have fever, leukocytosis, nor she having any diarrhea or vomiting.  She denies any urinary complaints.  Denies vaginal complaints.  Workup is otherwise reassuring.  Discussed plan for watchful waiting.  Patient will return if symptoms change or worsen.   Roxy Horseman, PA-C 01/20/23 0154    Zadie Rhine, MD 01/20/23 [phone]

## 2023-02-21 ENCOUNTER — Ambulatory Visit (INDEPENDENT_AMBULATORY_CARE_PROVIDER_SITE_OTHER): Payer: BC Managed Care – PPO | Admitting: Sports Medicine

## 2023-02-21 VITALS — BP 120/64 | Ht 69.0 in | Wt 190.0 lb

## 2023-02-21 DIAGNOSIS — Q7969 Other Ehlers-Danlos syndromes: Secondary | ICD-10-CM | POA: Diagnosis not present

## 2023-02-21 DIAGNOSIS — F333 Major depressive disorder, recurrent, severe with psychotic symptoms: Secondary | ICD-10-CM | POA: Diagnosis not present

## 2023-02-21 MED ORDER — AMBULATORY NON FORMULARY MEDICATION
0 refills | Status: DC
Start: 2023-02-21 — End: 2023-06-07

## 2023-02-21 NOTE — Assessment & Plan Note (Signed)
Because of her history of major depressive disorder and her use of multiple medications I did not want to use nortriptyline  I tried to encourage her to feel more hopeful as we have a number of options to try to lessen her chronic pain and hopefully help her feel more functional

## 2023-02-21 NOTE — Assessment & Plan Note (Signed)
Patient has had a good response for a number of months to physical therapy Now her pain is more extreme and probably we need to try some chronic medication to relieve this We will start low-dose naltrexone and gradually increased to 5 mg/day I would want to follow her up 4 to 6 weeks after starting this If she is not getting adequate relief we will try to go to twice daily

## 2023-02-21 NOTE — Progress Notes (Signed)
  Brandi Stanley - 27 y.o. female MRN [id number]  Date of birth: [birth year]  SUBJECTIVE:  Including CC & ROS  CC: Chronic generalized MSK pain worse at LB and Hips  Pt reports chronic back and diffuse body pain was previously improved w/ PT but has worsened since September and has been unable to perform daily activities without pain and exercises now worsen it. Lumbar pain is the worst and is aggravated by extension. Heat and ice have helped as well as mild stretching of the lower back. When pain flares, she requires assistance from her boyfriend to get up and move around the apartment. Also having pain in elbows, ankles, hips, and L wrist as well as muscle aches. 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  Is only sleeping 4 hours per night due to pain. Mood has been worsened due to chronic pain, has been seeing psychiatrist, and is compliant with psych medications. No current SI/HI.    HISTORY: Past Medical, Surgical, Social, and Family History Reviewed & Updated per EMR.   Pertinent Historical Findings include: Hypermobility and pain in mother and all her siblings.   Past history of significant depression and suicidal thoughts  DATA REVIEWED:   PHYSICAL EXAM:  VS: BP:120/64  HR: bpm  TEMP: ( )  RESP:   HT:5\' 9"  (175.3 cm)   WT:190 lb (86.2 kg)  BMI:28.05 PHYSICAL EXAM: Gen: slow movement, slightly flattened affect, calm, cooperative and interactive Integument: multiple keloids of bilateral upper extremities, mildly hyperextensible skin MSK: no hypotonia or atrophy Hands: hyperextension of thumb joints bilat Elbows: hyperextension of approx 5 degrees bilat Lumbar: full flexion, able to plant palms easily on floor, pain elicited from extension to standing position. Mild pain elicited w/ active rotation and side bending (L>R) Hips: unable to  test strength fully as hips sublux w/ resisted abduction bilat Knees: hyperextension at rest to approx 5 degrees bilat Ankles: Piezogenic pedal papul on L ankle laterally, not present on right Feet: navicular colapse of R>L, flattening of transverse plain bilat, splaying of first and second digits bilat   ASSESSMENT & PLAN: See problem based charting & AVS for pt instructions.  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 Will defer Nortriptyline as pt is on several other psychiatric medications including MAOi.  - Start daily home hip isometric holds, core pelvic tilt, and supported lumbar isometric extension exercises for strengthening and support - Follow up in 6 wks  Note I encouraged her to remain hopeful as we can help her find ways to lessen pain. She knows to seek help if she has any more severe suicidal symptoms

## 2023-02-21 NOTE — Patient Instructions (Addendum)
We are going to start you on low dose naltrexone. You will need to pick this up at a compounding pharmacy. We've sent it to: [facility] [id number], [place], [place] [address] Phone: [phone]  Start with 1 cc daily for 5 days. Increase to 2 cc daily for 5 days. 3 cc daily for 5 days 4 cc daily for 5 days 5 cc daily for 5 days and stay at this dose.  Start the exercises we gave you today.  Follow up in 6 weeks.

## 2023-02-28 ENCOUNTER — Other Ambulatory Visit (INDEPENDENT_AMBULATORY_CARE_PROVIDER_SITE_OTHER): Payer: BC Managed Care – PPO

## 2023-02-28 ENCOUNTER — Encounter: Payer: Self-pay | Admitting: Physician Assistant

## 2023-02-28 ENCOUNTER — Ambulatory Visit (INDEPENDENT_AMBULATORY_CARE_PROVIDER_SITE_OTHER): Payer: BC Managed Care – PPO | Admitting: Physician Assistant

## 2023-02-28 DIAGNOSIS — M545 Low back pain, unspecified: Secondary | ICD-10-CM | POA: Diagnosis not present

## 2023-02-28 DIAGNOSIS — M542 Cervicalgia: Secondary | ICD-10-CM

## 2023-02-28 DIAGNOSIS — G8929 Other chronic pain: Secondary | ICD-10-CM
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
## 2023-03-10 ENCOUNTER — Encounter: Payer: Self-pay | Admitting: Physical Medicine & Rehabilitation

## 2023-03-10 ENCOUNTER — Encounter
Payer: BC Managed Care – PPO | Attending: Physical Medicine & Rehabilitation | Admitting: Physical Medicine & Rehabilitation

## 2023-03-10 VITALS — BP 117/74 | HR 96 | Ht 69.0 in | Wt 201.4 lb

## 2023-03-10 DIAGNOSIS — M797 Fibromyalgia: Secondary | ICD-10-CM

## 2023-03-10 DIAGNOSIS — Q7969 Other Ehlers-Danlos syndromes: Secondary | ICD-10-CM

## 2023-03-10 DIAGNOSIS — N319 Neuromuscular dysfunction of bladder, unspecified: Secondary | ICD-10-CM | POA: Diagnosis not present

## 2023-03-10 NOTE — Progress Notes (Signed)
f °
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
## 2023-03-13 ENCOUNTER — Encounter: Payer: Self-pay | Admitting: Physical Medicine & Rehabilitation

## 2023-03-17 ENCOUNTER — Ambulatory Visit
Admission: RE | Admit: 2023-03-17 | Discharge: 2023-03-17 | Disposition: A | Discharge status: Home / Self Care | Payer: BC Managed Care – PPO | Source: Ambulatory Visit | Attending: Physician Assistant | Admitting: Physician Assistant

## 2023-03-17 DIAGNOSIS — G8929 Other chronic pain: Secondary | ICD-10-CM

## 2023-03-21 ENCOUNTER — Other Ambulatory Visit: Payer: Self-pay | Admitting: *Deleted

## 2023-03-21 MED ORDER — NALTREX 1.5 MG PO CAPS: ORAL_CAPSULE | ORAL | 1 refills | Status: DC

## 2023-04-10 ENCOUNTER — Ambulatory Visit (INDEPENDENT_AMBULATORY_CARE_PROVIDER_SITE_OTHER): Payer: BC Managed Care – PPO | Admitting: Family Medicine

## 2023-04-10 VITALS — BP 126/82 | Ht 70.0 in | Wt 195.0 lb

## 2023-04-10 DIAGNOSIS — M722 Plantar fascial fibromatosis: Secondary | ICD-10-CM

## 2023-04-10 DIAGNOSIS — Q7969 Other Ehlers-Danlos syndromes: Secondary | ICD-10-CM | POA: Diagnosis not present

## 2023-04-10 NOTE — Patient Instructions (Signed)
Continue titrating the naltrexone up like you have been. Continue home exercises - I would not recommend restarting physical therapy at this time. For your heels/feet arch supports and a heel cup will be helpful. Do home exercises for this just once a day 1 set of 10. Follow up with Dr. Darrick Penna in 2-3 weeks.

## 2023-04-11 ENCOUNTER — Encounter: Payer: Self-pay | Admitting: Family Medicine
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
## 2023-04-27 ENCOUNTER — Ambulatory Visit (INDEPENDENT_AMBULATORY_CARE_PROVIDER_SITE_OTHER): Payer: BC Managed Care – PPO | Admitting: Sports Medicine

## 2023-04-27 VITALS — BP 124/76 | Ht 69.0 in | Wt 195.0 lb

## 2023-04-27 DIAGNOSIS — Q7969 Other Ehlers-Danlos syndromes: Secondary | ICD-10-CM | POA: Diagnosis not present

## 2023-04-27 DIAGNOSIS — F332 Major depressive disorder, recurrent severe without psychotic features: Secondary | ICD-10-CM

## 2023-04-27 MED ORDER — CELECOXIB 200 MG PO CAPS: 200.0000 mg | ORAL_CAPSULE | Freq: Two times a day (BID) | ORAL | 2 refills | Status: DC

## 2023-04-27 NOTE — Assessment & Plan Note (Signed)
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

## 2023-04-27 NOTE — Patient Instructions (Addendum)
It was great to see you today! -Get some arnica gel and massage it on your shoulders/traps a few times a day -Try easy, short walks -You can do some easy flexion seated in a chair for your low back pain/spasm

## 2023-04-27 NOTE — Assessment & Plan Note (Signed)
This is severe and chronic and contributes to her MSK pain as well as is worsened when pain is too great I did not suggest any changes in psychotropic medications but recommended she keep follow ups and be consistent with the medication use I would recommend she rely on these and not use her Ativan

## 2023-04-27 NOTE — Progress Notes (Signed)
CC: Depression and chronic pain  Patient with EDS with chronic MSK pain Pain from lumbar to cervical spine Worse with standing or walking too much Both hips sublux and become painful  We were making some progress with her chronic MSK pain with oral naltrexone therapy low dose She had just gotten to dose of about 4 mg per day when depressive sxs worsened Hospitalized with suicidal ideation She is now on 4 psychotropic medications: Lamictal Lithium Parnate (MAO Inh) Spravato (nasal ketamine)  She states that even with all medications and recent IP TX she still feels depressed every day. She has had 23 psych admits per her history  She started back on Naltrexone post hospital stay but felt it may be worsening her depression so stopped Pain is worse off medication  Exam Depressed appearing F NAD BP 124/76   Ht 5\' 9"  (1.753 m)   Wt 195 lb (88.5 kg)   BMI 28.80 kg/m   Spasm noted on bilateral trapezius MM Neck ROM is full but feels painful Lumbar ROM is full No real paraspinal spasm Hip ROM increased with no subluxation noted on exam Neuro testing normal

## 2023-05-11 ENCOUNTER — Encounter
Payer: BC Managed Care – PPO | Attending: Physical Medicine & Rehabilitation | Admitting: Physical Medicine & Rehabilitation

## 2023-05-11 ENCOUNTER — Encounter: Payer: Self-pay | Admitting: Physical Medicine & Rehabilitation

## 2023-05-11 VITALS — BP 119/76 | HR 76 | Ht 69.0 in | Wt 201.6 lb

## 2023-05-11 DIAGNOSIS — Q7969 Other Ehlers-Danlos syndromes: Secondary | ICD-10-CM | POA: Insufficient documentation

## 2023-05-11 DIAGNOSIS — M797 Fibromyalgia: Secondary | ICD-10-CM | POA: Insufficient documentation

## 2023-05-11 DIAGNOSIS — F32A Depression, unspecified: Secondary | ICD-10-CM | POA: Diagnosis not present

## 2023-05-11 MED ORDER — PREGABALIN 25 MG PO CAPS: 25.0000 mg | ORAL_CAPSULE | Freq: Two times a day (BID) | ORAL | 3 refills | Status: DC
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
## 2023-05-22 ENCOUNTER — Ambulatory Visit: Payer: BC Managed Care – PPO | Attending: Cardiology | Admitting: Cardiology

## 2023-05-22 ENCOUNTER — Encounter: Payer: Self-pay | Admitting: Cardiology

## 2023-05-22 ENCOUNTER — Ambulatory Visit: Payer: BC Managed Care – PPO | Attending: Cardiology

## 2023-05-22 VITALS — BP 124/68 | HR 64 | Ht 69.0 in | Wt 201.8 lb

## 2023-05-22 DIAGNOSIS — R002 Palpitations: Secondary | ICD-10-CM

## 2023-05-22 DIAGNOSIS — R42 Dizziness and giddiness: Secondary | ICD-10-CM | POA: Diagnosis not present

## 2023-05-22 DIAGNOSIS — R001 Bradycardia, unspecified: Secondary | ICD-10-CM

## 2023-05-22 NOTE — Patient Instructions (Addendum)
Medication Instructions:  Your physician recommends that you continue on your current medications as directed. Please refer to the Current Medication list given to you today.  *If you need a refill on your cardiac medications before your next appointment, please call your pharmacy*   Testing/Procedures: ZIO XT- Long Term Monitor Instructions  Your physician has requested you wear a ZIO patch monitor for 7 days.  This is a single patch monitor. [employer] supplies one patch monitor per enrollment. Additional stickers are not available. Please do not apply patch if you will be having a Nuclear Stress Test,  Echocardiogram, Cardiac CT, MRI, or Chest Xray during the period you would be wearing the  monitor. The patch cannot be worn during these tests. You cannot remove and re-apply the  ZIO XT patch monitor.  Your ZIO patch monitor will be mailed 3 day USPS to your address on file. It may take 3-5 days  to receive your monitor after you have been enrolled.  Once you have received your monitor, please review the enclosed instructions. Your monitor  has already been registered assigning a specific monitor serial # to you.  Billing and Patient Assistance Program Information  We have supplied [employer] with any of your insurance information on file for billing purposes. [employer] offers a sliding scale Patient Assistance Program for patients that do not have  insurance, or whose insurance does not completely cover the cost of the ZIO monitor.  You must apply for the Patient Assistance Program to qualify for this discounted rate.  To apply, please call [employer] at [phone], select option 4, select option 2, ask to apply for  Patient Assistance Program. Meredeth Ide will ask your household income, and how many people  are in your household. They will quote your out-of-pocket cost based on that information.  [employer] will also be able to set up a 81-month, interest-free payment plan if needed.  Applying  the monitor   Shave hair from upper left chest.  Hold abrader disc by orange tab. Rub abrader in 40 strokes over the upper left chest as  indicated in your monitor instructions.  Clean area with 4 enclosed alcohol pads. Let dry.  Apply patch as indicated in monitor instructions. Patch will be placed under collarbone on left  side of chest with arrow pointing upward.  Rub patch adhesive wings for 2 minutes. Remove white label marked "1". Remove the white  label marked "2". Rub patch adhesive wings for 2 additional minutes.  While looking in a mirror, press and release button in center of patch. A small green light will  flash 3-4 times. This will be your only indicator that the monitor has been turned on.  Do not shower for the first 24 hours. You may shower after the first 24 hours.  Press the button if you feel a symptom. You will hear a small click. Record Date, Time and  Symptom in the Patient Logbook.  When you are ready to remove the patch, follow instructions on the last 2 pages of Patient  Logbook. Stick patch monitor onto the last page of Patient Logbook.  Place Patient Logbook in the blue and white box. Use locking tab on box and tape box closed  securely. The blue and white box has prepaid postage on it. Please place it in the mailbox as  soon as possible. Your physician should have your test results approximately 7 days after the  monitor has been mailed back to [facility].  Call [facility]  at [phone] if you have questions regarding  your ZIO XT patch monitor. Call them immediately if you see an orange light blinking on your  monitor.  If your monitor falls off in less than 4 days, contact our Monitor department at [phone].  If your monitor becomes loose or falls off after 4 days call [employer] at [phone] for  suggestions on securing your monitor    Follow-Up: At [facility], you and your health needs are our priority.  As part  of our continuing mission to provide you with exceptional heart care, we have created designated Provider Care Teams.  These Care Teams include your primary Cardiologist (physician) and Advanced Practice Providers (APPs -  Physician Assistants and Nurse Practitioners) who all work together to provide you with the care you need, when you need it.  We recommend signing up for the patient portal called "MyChart".  Sign up information is provided on this After Visit Summary.  MyChart is used to connect with patients for Virtual Visits (Telemedicine).  Patients are able to view lab/test results, encounter notes, upcoming appointments, etc.  Non-urgent messages can be sent to your provider as well.   To learn more about what you can do with MyChart, go to [email or web address].    Your next appointment:   6 month(s)  Provider:   Little Ishikawa, MD

## 2023-05-22 NOTE — Progress Notes (Unsigned)
Enrolled for [employer] to mail a ZIO XT long term holter monitor to the patients address on file.  

## 2023-05-24 DIAGNOSIS — R002 Palpitations: Secondary | ICD-10-CM

## 2023-05-25 ENCOUNTER — Ambulatory Visit: Payer: BC Managed Care – PPO | Admitting: Sports Medicine

## 2023-06-01 ENCOUNTER — Ambulatory Visit: Payer: BC Managed Care – PPO | Admitting: Sports Medicine

## 2023-06-01 VITALS — BP 110/72 | Ht 69.0 in | Wt 200.0 lb

## 2023-06-01 DIAGNOSIS — F332 Major depressive disorder, recurrent severe without psychotic features: Secondary | ICD-10-CM | POA: Diagnosis not present

## 2023-06-01 DIAGNOSIS — Q7969 Other Ehlers-Danlos syndromes: Secondary | ICD-10-CM

## 2023-06-01 DIAGNOSIS — M5440 Lumbago with sciatica, unspecified side: Secondary | ICD-10-CM

## 2023-06-01 NOTE — Progress Notes (Signed)
F/U EDS and ongoing back pain  Patient has had a couple of consultations since last visit with me;  Cardiology has evaluated and noted baseline tachycarda.  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  Still on monitor but RX may be compression hose and Salt supplementation.  Ongoing generalized muscle pain.  Seen at [facility]. Plan aquatherapy and trial of Lyrica 25 BID.  Aquatherapy seems helpful.  Has not tried Lyrica yet waiting for clearance from her psychiatrist.  Her mood is better after another RX of Spravato.  She feels that has helped more than any antidepressant.  In past nortriptyline helped her pain as did LD Naltrexone.  Nortriptyline stopped as she was tried on different psych meds.    Needs better pain relief as she remains at pain most of time.  Low back exercises have helped and question if we can continue to push new ones.  PE Less depressed appearance than last visit and not tearful. BP 110/72   Ht 5\' 9"  (1.753 m)   Wt 200 lb (90.7 kg)   BMI 29.53 kg/m  Pain with only 10 deg of lat bending or lumbar extension Pain on flexion starts at 10 deg and increase by 30 deg TTP noted over lumbar region generally TTP more over RT SIJ  Able to do knee to chest and knee to opposite shoulder Pelvic tilt OK Pelvic rock with knee flexion A & P is OK

## 2023-06-01 NOTE — Assessment & Plan Note (Signed)
She feels there is some improvement Encouraged to continue close communication and reach out if any crisis

## 2023-06-01 NOTE — Assessment & Plan Note (Signed)
I think the trial on Lyrica is reasonable if OK with psych meds Generalized pain needs better control  See if on current meds she can restart nortriptyline for pain

## 2023-06-01 NOTE — Assessment & Plan Note (Signed)
I think the Aqua therapy is a good approach  Started today on Williams Flexion while lying as she seems able to start these exercises now

## 2023-06-08 ENCOUNTER — Encounter
Payer: BC Managed Care – PPO | Attending: Physical Medicine & Rehabilitation | Admitting: Physical Medicine & Rehabilitation

## 2023-06-08 ENCOUNTER — Encounter: Payer: Self-pay | Admitting: Physical Medicine & Rehabilitation

## 2023-06-08 VITALS — BP 110/71 | HR 75 | Ht 69.0 in | Wt 204.0 lb

## 2023-06-08 DIAGNOSIS — Q7969 Other Ehlers-Danlos syndromes: Secondary | ICD-10-CM | POA: Diagnosis present

## 2023-06-08 DIAGNOSIS — M797 Fibromyalgia: Secondary | ICD-10-CM | POA: Diagnosis not present

## 2023-06-08 DIAGNOSIS — F32A Depression, unspecified: Secondary | ICD-10-CM | POA: Diagnosis present

## 2023-06-08 MED ORDER — TIZANIDINE HCL 2 MG PO CAPS: 2.0000 mg | ORAL_CAPSULE | Freq: Two times a day (BID) | ORAL | 2 refills | Status: DC | PRN

## 2023-06-08 MED ORDER — TIZANIDINE HCL 2 MG PO CAPS: 2.0000 mg | ORAL_CAPSULE | Freq: Three times a day (TID) | ORAL | 2 refills | Status: DC
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
## 2023-06-15 ENCOUNTER — Telehealth: Payer: Self-pay | Admitting: Cardiology

## 2023-06-15 NOTE — Telephone Encounter (Signed)
Pt is requesting a callback regarding monitor results. Please advise

## 2023-06-15 NOTE — Telephone Encounter (Signed)
Informed pt Dr. Bjorn Pippin has not reviewed monitor results yet and we will call her with an update once he does. Pt verbalized understanding. No further questions at this time.

## 2023-07-25 ENCOUNTER — Ambulatory Visit: Payer: BC Managed Care – PPO | Admitting: Sports Medicine

## 2023-08-08 ENCOUNTER — Ambulatory Visit: Payer: BC Managed Care – PPO | Admitting: Sports Medicine

## 2023-09-08 ENCOUNTER — Encounter: Payer: Medicare Other | Attending: Physical Medicine & Rehabilitation | Admitting: Physical Medicine & Rehabilitation

## 2023-09-08 ENCOUNTER — Encounter: Payer: Self-pay | Admitting: Physical Medicine & Rehabilitation

## 2023-09-08 VITALS — BP 125/81 | HR 104 | Ht 69.0 in | Wt 203.0 lb

## 2023-09-08 DIAGNOSIS — M797 Fibromyalgia: Secondary | ICD-10-CM

## 2023-09-08 DIAGNOSIS — F32A Depression, unspecified: Secondary | ICD-10-CM | POA: Diagnosis not present

## 2023-09-08 DIAGNOSIS — Q7969 Other Ehlers-Danlos syndromes: Secondary | ICD-10-CM

## 2023-09-08 DIAGNOSIS — R200 Anesthesia of skin: Secondary | ICD-10-CM | POA: Diagnosis not present

## 2023-09-08 MED ORDER — TIZANIDINE HCL 2 MG PO CAPS: 2.0000 mg | ORAL_CAPSULE | Freq: Two times a day (BID) | ORAL | 2 refills | Status: AC | PRN
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
## 2023-11-08 ENCOUNTER — Ambulatory Visit: Payer: BC Managed Care – PPO | Attending: Cardiology | Admitting: Cardiology

## 2023-11-22 ENCOUNTER — Emergency Department (HOSPITAL_COMMUNITY)

## 2023-11-22 ENCOUNTER — Other Ambulatory Visit: Payer: Self-pay

## 2023-11-22 ENCOUNTER — Encounter (HOSPITAL_COMMUNITY): Payer: Self-pay

## 2023-11-22 ENCOUNTER — Emergency Department (HOSPITAL_COMMUNITY)
Admission: EM | Admit: 2023-11-22 | Discharge: 2023-11-22 | Disposition: A | Discharge status: Home / Self Care | Attending: Emergency Medicine | Admitting: Emergency Medicine

## 2023-11-22 DIAGNOSIS — R197 Diarrhea, unspecified: Secondary | ICD-10-CM | POA: Insufficient documentation

## 2023-11-22 DIAGNOSIS — R112 Nausea with vomiting, unspecified: Secondary | ICD-10-CM | POA: Diagnosis present

## 2023-11-22 LAB — CBC WITH DIFFERENTIAL/PLATELET
Abs Immature Granulocytes: 0.03 10*3/uL (ref 0.00–0.07)
Basophils Absolute: 0 10*3/uL (ref 0.0–0.1)
Basophils Relative: 0 %
Eosinophils Absolute: 0 10*3/uL (ref 0.0–0.5)
Eosinophils Relative: 0 %
HCT: 46.6 % — ABNORMAL HIGH (ref 36.0–46.0)
Hemoglobin: 15.2 g/dL — ABNORMAL HIGH (ref 12.0–15.0)
Immature Granulocytes: 0 %
Lymphocytes Relative: 4 %
Lymphs Abs: 0.3 10*3/uL — ABNORMAL LOW (ref 0.7–4.0)
MCH: 29.7 pg (ref 26.0–34.0)
MCHC: 32.6 g/dL (ref 30.0–36.0)
MCV: 91.2 fL (ref 80.0–100.0)
Monocytes Absolute: 0.4 10*3/uL (ref 0.1–1.0)
Monocytes Relative: 4 %
Neutro Abs: 7.7 10*3/uL (ref 1.7–7.7)
Neutrophils Relative %: 92 %
Platelets: 179 10*3/uL (ref 150–400)
RBC: 5.11 MIL/uL (ref 3.87–5.11)
RDW: 12.7 % (ref 11.5–15.5)
WBC: 8.4 10*3/uL (ref 4.0–10.5)
nRBC: 0 % (ref 0.0–0.2)

## 2023-11-22 LAB — URINALYSIS, W/ REFLEX TO CULTURE (INFECTION SUSPECTED)
Bacteria, UA: NONE SEEN
Bilirubin Urine: NEGATIVE
Glucose, UA: NEGATIVE mg/dL
Hgb urine dipstick: NEGATIVE
Ketones, ur: 80 mg/dL — AB
Leukocytes,Ua: NEGATIVE
Nitrite: NEGATIVE
Protein, ur: >=300 mg/dL — AB
Specific Gravity, Urine: 1.024 (ref 1.005–1.030)
pH: 8 (ref 5.0–8.0)

## 2023-11-22 LAB — COMPREHENSIVE METABOLIC PANEL WITH GFR
ALT: 18 U/L (ref 0–44)
AST: 25 U/L (ref 15–41)
Albumin: 4.7 g/dL (ref 3.5–5.0)
Alkaline Phosphatase: 65 U/L (ref 38–126)
Anion gap: 16 — ABNORMAL HIGH (ref 5–15)
BUN: 11 mg/dL (ref 6–20)
CO2: 17 mmol/L — ABNORMAL LOW (ref 22–32)
Calcium: 9.7 mg/dL (ref 8.9–10.3)
Chloride: 106 mmol/L (ref 98–111)
Creatinine, Ser: 0.93 mg/dL (ref 0.44–1.00)
GFR, Estimated: >60 mL/min (ref >60)
Glucose, Bld: 87 mg/dL (ref 70–99)
Potassium: 3.8 mmol/L (ref 3.5–5.1)
Sodium: 139 mmol/L (ref 135–145)
Total Bilirubin: 1.4 mg/dL — ABNORMAL HIGH (ref 0.0–1.2)
Total Protein: 8.2 g/dL — ABNORMAL HIGH (ref 6.5–8.1)

## 2023-11-22 LAB — RAPID URINE DRUG SCREEN, HOSP PERFORMED
Amphetamines: NOT DETECTED
Barbiturates: NOT DETECTED
Benzodiazepines: NOT DETECTED
Cocaine: NOT DETECTED
Opiates: NOT DETECTED
Tetrahydrocannabinol: NOT DETECTED

## 2023-11-22 LAB — LIPASE, BLOOD: Lipase: 32 U/L (ref 11–51)

## 2023-11-22 LAB — PREGNANCY, URINE: Preg Test, Ur: NEGATIVE

## 2023-11-22 MED ORDER — SODIUM CHLORIDE 0.9 % IV BOLUS
1000.0000 mL | Freq: Once | INTRAVENOUS | Status: AC
  Administered 2023-11-22: 1000 mL via INTRAVENOUS

## 2023-11-22 MED ORDER — ONDANSETRON HCL 4 MG/2ML IJ SOLN
4.0000 mg | Freq: Once | INTRAMUSCULAR | Status: AC
  Administered 2023-11-22: 4 mg via INTRAVENOUS
  Filled 2023-11-22: qty 2

## 2023-11-22 MED ORDER — KETAMINE HCL 50 MG/5ML IJ SOSY
0.3000 mg/kg | PREFILLED_SYRINGE | Freq: Once | INTRAMUSCULAR | Status: AC
  Administered 2023-11-22: 28 mg via INTRAVENOUS
  Filled 2023-11-22: qty 5

## 2023-11-22 MED ORDER — ONDANSETRON HCL 4 MG PO TABS
4.0000 mg | ORAL_TABLET | Freq: Four times a day (QID) | ORAL | 0 refills | Status: AC
Start: 2023-11-22 — End: ?

## 2023-11-22 MED ORDER — KETOROLAC TROMETHAMINE 15 MG/ML IJ SOLN
15.0000 mg | Freq: Once | INTRAMUSCULAR | Status: AC
  Administered 2023-11-22: 15 mg via INTRAVENOUS
  Filled 2023-11-22: qty 1

## 2023-11-22 NOTE — ED Provider Notes (Signed)
 [place] EMERGENCY DEPARTMENT AT [facility] Provider Note   CSN: [id number] Arrival date & time: 11/22/23  1528     History  Chief Complaint  Patient presents with   Vomiting   Hip Pain         Brandi Stanley is a 28 y.o. female.  28 year old female with prior medical history as detailed below presents for evaluation.  Patient reports onset of nausea, vomiting, diarrhea yesterday evening.  Symptoms persisted overnight.  This morning she reports that her nausea is minimally improved.  She denies fever.  She denies abdominal pain.  She has a longstanding history of chronic pain.  She reports that her chronic pain is exacerbated by her current illness.  The history is provided by the patient.       Home Medications Prior to Admission medications   Medication Sig Start Date End Date Taking? Authorizing Provider  AUVELITY 45-105 MG TBCR Take 1 tablet by mouth 2 (two) times daily. 07/29/23   [provider]  celecoxib (CELEBREX) 200 MG capsule Take 1 capsule (200 mg total) by mouth 2 (two) times daily. Patient not taking: Reported on 09/08/2023 04/27/23   Enid Baas, MD  Cholecalciferol (VITAMIN D3) 1.25 MG (50000 UT) CAPS Take 1 capsule by mouth once a week.    [provider]  Cholecalciferol 1.25 MG (50000 UT) capsule Take by mouth. 08/25/22   [provider]  diclofenac sodium (VOLTAREN) 1 % GEL Apply 4 g topically 4 (four) times daily as needed. 03/05/19   Hilts, Casimiro Needle, MD  dicyclomine (BENTYL) 10 MG capsule Take 10 mg by mouth daily as needed for spasms.    [provider]  doxazosin (CARDURA) 2 MG tablet Take 6 mg by mouth at bedtime.    [provider]  EPINEPHrine (EPIPEN 2-PAK) 0.3 mg/0.3 mL IJ SOAJ injection Inject 0.3 mLs (0.3 mg total) into the muscle as needed for anaphylaxis. 03/04/19   Fulp, Cammie, MD  Esketamine HCl, 84 MG Dose, (SPRAVATO, 84 MG DOSE,) 28 MG/DEVICE SOPK 84 mg. 10/27/22   [provider]   lamoTRIgine (LAMICTAL) 150 MG tablet Take 150 mg by mouth 2 (two) times daily.    [provider]  levonorgestrel (MIRENA) 20 MCG/DAY IUD by Intrauterine route.    [provider]  LORazepam (ATIVAN) 1 MG tablet one half tablet (0.5 mg dose) every 8 (eight) hours as needed. 10/08/21   [provider]  nortriptyline (PAMELOR) 25 MG capsule Take 25 mg by mouth 3 (three) times daily.    [provider]  SPRAVATO, 84 MG DOSE, 28 MG/DEVICE SOPK Place 1 spray into both nostrils once a week.    [provider]  tizanidine (ZANAFLEX) 2 MG capsule Take 1 capsule (2 mg total) by mouth 2 (two) times daily as needed for muscle spasms. 09/08/23   Fanny Dance, MD      Allergies    Bee venom, Oxycodone-acetaminophen, Penicillins, and Percocet [oxycodone-acetaminophen]    Review of Systems   Review of Systems  All other systems reviewed and are negative.   Physical Exam Updated Vital Signs BP (!) 156/136   Pulse (!) 141   Temp 98.3 F (36.8 C) (Oral)   Resp 18   Ht 5\' 9"  (1.753 m)   Wt 92.1 kg   SpO2 100%   BMI 29.98 kg/m  Physical Exam Vitals and nursing note reviewed.  Constitutional:      General: She is not in acute distress.  Appearance: Normal appearance. She is well-developed.  HENT:     Head: Normocephalic and atraumatic.  Eyes:     Conjunctiva/sclera: Conjunctivae normal.     Pupils: Pupils are equal, round, and reactive to light.  Cardiovascular:     Rate and Rhythm: Normal rate and regular rhythm.     Heart sounds: Normal heart sounds.  Pulmonary:     Effort: Pulmonary effort is normal. No respiratory distress.     Breath sounds: Normal breath sounds.  Abdominal:     General: There is no distension.     Palpations: Abdomen is soft.     Tenderness: There is no abdominal tenderness.  Musculoskeletal:        General: No deformity. Normal range of motion.     Cervical back: Normal range of motion and neck supple.  Skin:     General: Skin is warm and dry.  Neurological:     General: No focal deficit present.     Mental Status: She is alert and oriented to person, place, and time.     ED Results / Procedures / Treatments   Labs (all labs ordered are listed, but only abnormal results are displayed) Labs Reviewed  CBC WITH DIFFERENTIAL/PLATELET  COMPREHENSIVE METABOLIC PANEL WITH GFR  LIPASE, BLOOD  URINALYSIS, W/ REFLEX TO CULTURE (INFECTION SUSPECTED)  HCG, SERUM, QUALITATIVE    EKG EKG Interpretation Date/Time:  Wednesday November 22 2023 15:50:23 EDT Ventricular Rate:  131 PR Interval:  152 QRS Duration:  82 QT Interval:  290 QTC Calculation: 429 R Axis:   98  Text Interpretation: Sinus tachycardia Borderline right axis deviation Borderline Q waves in lateral leads Borderline repolarization abnormality Confirmed by Kristine Royal [phone]) on 11/22/2023 4:00:03 PM  Radiology No results found.  Procedures Procedures    Medications Ordered in ED Medications  sodium chloride 0.9 % bolus 1,000 mL (has no administration in time range)  ondansetron (ZOFRAN) injection 4 mg (has no administration in time range)  ketorolac (TORADOL) 15 MG/ML injection 15 mg (has no administration in time range)    ED Course/ Medical Decision Making/ A&P                                 Medical Decision Making Amount and/or Complexity of Data Reviewed Labs: ordered. Radiology: ordered.  Risk Prescription drug management.    Medical Screen Complete  This patient presented to the ED with complaint of nausea, vomiting, diarrhea.  This complaint involves an extensive number of treatment options. The initial differential diagnosis includes, but is not limited to, gastroenteritis, metabolic abnormality, etc.  This presentation is: Acute, Chronic, Self-Limited, Previously Undiagnosed, Uncertain Prognosis, Complicated, Systemic Symptoms, and Threat to Life/Bodily Function  Patient is presenting with nausea,  vomiting, diarrhea, abdominal cramps.  She also reports that she has diffuse muscle cramping related to her longstanding history of EDS.  She reports that when she gets any illness her muscle cramps become worse.  She denies fever.  Patient appears improved with IV fluids, anti-emetics.  Patient given nonnarcotic pain medication.  She requested imaging of her right hip which apparently has been bothering her.  This is without evidence of acute pathology on imaging.  Patient is advised to closely follow-up with her PCP in the outpatient setting.  Importance of close follow-up stressed.  Strict return precautions given and understood.  Co morbidities that complicated the patient's evaluation  See HPI   Additional history  obtained: External records from outside sources obtained and reviewed including prior ED visits and prior Inpatient records.   Problem List / ED Course:  Nausea, vomiting, diarrhea   Reevaluation:  After the interventions noted above, I reevaluated the patient and found that they have: Improved  Disposition:  After consideration of the diagnostic results and the patients response to treatment, I feel that the patent would benefit from close outpatient follow-up.          Final Clinical Impression(s) / ED Diagnoses Final diagnoses:  Nausea and vomiting, unspecified vomiting type    Rx / DC Orders ED Discharge Orders          Ordered    ondansetron (ZOFRAN) 4 MG tablet  Every 6 hours        11/22/23 2029              Wynetta Fines, MD 11/22/23 2146

## 2023-11-22 NOTE — Discharge Instructions (Addendum)
 Return for any problem.   Use Zofran for nausea. Drink plenty of fluids.   The x-ray obtained of your pelvis and hip is without significant acute findings.  There is specifically no fracture or dislocation.

## 2023-11-22 NOTE — ED Triage Notes (Signed)
 Pt arrived reporting Vomiting, Diarrhea, Pain Flare Up and Muscle cramping. Pt states he has a history of EDS, no other symptoms reported.

## 2024-01-04 ENCOUNTER — Encounter: Payer: Medicare Other | Admitting: Physical Medicine & Rehabilitation

## 2024-01-05 ENCOUNTER — Ambulatory Visit: Payer: Medicare Other | Admitting: Physical Medicine & Rehabilitation

## 2024-02-22 ENCOUNTER — Encounter: Attending: Physical Medicine & Rehabilitation | Admitting: Physical Medicine & Rehabilitation

## 2024-02-22 ENCOUNTER — Encounter: Payer: Self-pay | Admitting: Physical Medicine & Rehabilitation

## 2024-02-22 VITALS — BP 120/79 | HR 90 | Ht 69.0 in | Wt 197.0 lb

## 2024-02-22 DIAGNOSIS — Q7969 Other Ehlers-Danlos syndromes: Secondary | ICD-10-CM | POA: Diagnosis not present

## 2024-02-22 DIAGNOSIS — M797 Fibromyalgia: Secondary | ICD-10-CM | POA: Insufficient documentation

## 2024-02-22 DIAGNOSIS — F32A Depression, unspecified: Secondary | ICD-10-CM | POA: Insufficient documentation

## 2024-02-22 DIAGNOSIS — R200 Anesthesia of skin: Secondary | ICD-10-CM | POA: Insufficient documentation

## 2024-02-22 MED ORDER — SUZETRIGINE 50 MG PO TABS
50.0000 mg | ORAL_TABLET | Freq: Two times a day (BID) | ORAL | 0 refills | Status: AC | PRN
Start: 2024-02-22 — End: ?
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
## 2024-05-24 ENCOUNTER — Encounter: Attending: Physical Medicine & Rehabilitation | Admitting: Physical Medicine & Rehabilitation

## 2024-05-24 ENCOUNTER — Encounter: Payer: Self-pay | Admitting: Physical Medicine & Rehabilitation

## 2024-05-24 VITALS — BP 128/83 | HR 85 | Ht 69.0 in | Wt 202.0 lb

## 2024-05-24 DIAGNOSIS — M545 Low back pain, unspecified: Secondary | ICD-10-CM | POA: Diagnosis not present

## 2024-05-24 DIAGNOSIS — Q7969 Other Ehlers-Danlos syndromes: Secondary | ICD-10-CM | POA: Insufficient documentation

## 2024-05-24 DIAGNOSIS — M797 Fibromyalgia: Secondary | ICD-10-CM | POA: Insufficient documentation

## 2024-05-24 DIAGNOSIS — R200 Anesthesia of skin: Secondary | ICD-10-CM | POA: Diagnosis present

## 2024-05-24 DIAGNOSIS — G8929 Other chronic pain: Secondary | ICD-10-CM | POA: Insufficient documentation
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
## 2024-08-26 ENCOUNTER — Encounter: Payer: Self-pay | Admitting: Physical Medicine & Rehabilitation

## 2024-08-26 ENCOUNTER — Encounter: Attending: Physical Medicine & Rehabilitation | Admitting: Physical Medicine & Rehabilitation

## 2024-08-26 VITALS — BP 124/77 | HR 100 | Ht 69.0 in | Wt 201.0 lb

## 2024-08-26 DIAGNOSIS — Q7969 Other Ehlers-Danlos syndromes: Secondary | ICD-10-CM | POA: Insufficient documentation

## 2024-08-26 DIAGNOSIS — M797 Fibromyalgia: Secondary | ICD-10-CM | POA: Insufficient documentation

## 2024-08-26 DIAGNOSIS — M545 Low back pain, unspecified: Secondary | ICD-10-CM | POA: Insufficient documentation

## 2024-08-26 DIAGNOSIS — G8929 Other chronic pain: Secondary | ICD-10-CM | POA: Insufficient documentation
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
## 2024-10-01 ENCOUNTER — Encounter: Attending: Physical Medicine & Rehabilitation | Admitting: Physical Medicine & Rehabilitation

## 2024-10-01 ENCOUNTER — Ambulatory Visit: Admitting: Physician Assistant

## 2024-11-25 ENCOUNTER — Encounter: Admitting: Physical Medicine & Rehabilitation
